# Patient Record
Sex: Female | Born: 1960 | Race: White | Hispanic: No | Smoking: Former smoker
Health system: Southern US, Community
[De-identification: ages and names within clinical notes are randomized; demographics above are authoritative.]

## PROBLEM LIST (undated history)

## (undated) DIAGNOSIS — D219 Benign neoplasm of connective and other soft tissue, unspecified: Secondary | ICD-10-CM

## (undated) DIAGNOSIS — I341 Nonrheumatic mitral (valve) prolapse: Secondary | ICD-10-CM

## (undated) DIAGNOSIS — R87619 Unspecified abnormal cytological findings in specimens from cervix uteri: Secondary | ICD-10-CM

## (undated) DIAGNOSIS — R011 Cardiac murmur, unspecified: Secondary | ICD-10-CM

## (undated) HISTORY — PX: DILATION AND CURETTAGE OF UTERUS: SHX78

## (undated) HISTORY — DX: Benign neoplasm of connective and other soft tissue, unspecified: D21.9

## (undated) HISTORY — DX: Nonrheumatic mitral (valve) prolapse: I34.1

## (undated) HISTORY — PX: ENDOMETRIAL BIOPSY: SHX622

## (undated) HISTORY — PX: OTHER SURGICAL HISTORY: SHX169

## (undated) HISTORY — DX: Unspecified abnormal cytological findings in specimens from cervix uteri: R87.619

## (undated) HISTORY — PX: TUBAL LIGATION: SHX77

## (undated) HISTORY — PX: REDUCTION MAMMAPLASTY: SUR839

## (undated) HISTORY — PX: CERVICAL BIOPSY  W/ LOOP ELECTRODE EXCISION: SUR135

## (undated) HISTORY — DX: Cardiac murmur, unspecified: R01.1

---

## 1998-11-09 ENCOUNTER — Other Ambulatory Visit: Admission: RE | Admit: 1998-11-09 | Discharge: 1998-11-09 | Payer: Self-pay | Admitting: Obstetrics and Gynecology

## 1999-12-07 ENCOUNTER — Other Ambulatory Visit: Admission: RE | Admit: 1999-12-07 | Discharge: 1999-12-07 | Payer: Self-pay | Admitting: Obstetrics and Gynecology

## 2001-07-12 ENCOUNTER — Other Ambulatory Visit: Admission: RE | Admit: 2001-07-12 | Discharge: 2001-07-12 | Payer: Self-pay | Admitting: Obstetrics and Gynecology

## 2001-08-16 ENCOUNTER — Ambulatory Visit (HOSPITAL_COMMUNITY): Admission: RE | Admit: 2001-08-16 | Discharge: 2001-08-16 | Payer: Self-pay | Admitting: Obstetrics and Gynecology

## 2001-08-16 ENCOUNTER — Encounter (INDEPENDENT_AMBULATORY_CARE_PROVIDER_SITE_OTHER): Payer: Self-pay | Admitting: Specialist

## 2002-08-27 ENCOUNTER — Other Ambulatory Visit: Admission: RE | Admit: 2002-08-27 | Discharge: 2002-08-27 | Payer: Self-pay | Admitting: Obstetrics and Gynecology

## 2004-06-29 ENCOUNTER — Other Ambulatory Visit: Admission: RE | Admit: 2004-06-29 | Discharge: 2004-06-29 | Payer: Self-pay | Admitting: Obstetrics and Gynecology

## 2005-09-20 ENCOUNTER — Other Ambulatory Visit: Admission: RE | Admit: 2005-09-20 | Discharge: 2005-09-20 | Payer: Self-pay | Admitting: Obstetrics and Gynecology

## 2008-09-23 ENCOUNTER — Encounter: Admission: RE | Admit: 2008-09-23 | Discharge: 2008-09-23 | Payer: Self-pay | Admitting: Obstetrics and Gynecology

## 2009-09-17 ENCOUNTER — Encounter: Payer: Self-pay | Admitting: Internal Medicine

## 2009-09-24 ENCOUNTER — Encounter (INDEPENDENT_AMBULATORY_CARE_PROVIDER_SITE_OTHER): Payer: Self-pay | Admitting: *Deleted

## 2009-10-02 ENCOUNTER — Ambulatory Visit: Payer: Self-pay | Admitting: Gastroenterology

## 2009-10-13 ENCOUNTER — Encounter: Admission: RE | Admit: 2009-10-13 | Discharge: 2009-10-13 | Payer: Self-pay | Admitting: Obstetrics and Gynecology

## 2010-10-12 NOTE — Letter (Signed)
Summary: [facility]   Imported By: Lester Streetman 10/06/2009 14:50:39  _____________________________________________________________________  External Attachment:    Type:   Image     Comment:   External Document

## 2010-10-12 NOTE — Miscellaneous (Signed)
Summary: Lec previsit  Clinical Lists Changes  Observations: Added new observation of NKA: T (10/02/2009 13:30)  Appended Document: Lec previsit Pt came in for her previsit and decided  she was not ready to have the colon yet. Will cancel her appt with Dr Christella Hartigan on 10/14/2009 and call back later to reschedule.

## 2010-10-12 NOTE — Letter (Signed)
Summary: Pre Visit No Show Letter  [facility]  [address] [place], [place] [address]   Phone: [phone]  Fax: [phone]        September 24, 2009 MRN: [id number]    Ashley Malone [address] [place], [place]  [address]    Dear Ms. SPRINGSTON,   We have been unable to reach you by phone concerning the pre-procedure visit that you missed on 09/24/2009. For this reason,your procedure scheduled on 10/05/2009 has been cancelled. Our scheduling staff will gladly assist you with rescheduling your appointments at a more convenient time. Please call our office at [phone] between the hours of 8:00am and 5:00pm, press option #2 to reach an appointment scheduler. Please consider updating your contact numbers at this time so that we can reach you by phone in the future with schedule changes or results.    Thank you,    Ezra Sites RN [facility]

## 2011-01-28 NOTE — H&P (Signed)
[facility]  Patient:    Ashley Malone, Ashley Malone Visit Number: [id number] MRN: [id number]          Service Type: Attending:  Juluis Mire, M.D. Dictated by:   Juluis Mire, M.D.                           History and Physical  HISTORY OF PRESENT ILLNESS:   [age]-year-old gravida 2, para 2, white female presents for permanent sterilization.  In relation to the present admission, the patient desires permanent sterilization.  Alternative forms of birth control have been discussed.  The potential irreversibility of sterilization was explained.  Failure rate of 1 in 200 was quoted.  Failures can be in the form of ectopic pregnancy, requiring further surgical management.  The patient voices understanding of indications and other options and desires to proceed with the permanent sterilization.  ALLERGIES:                    In terms of allergies, no known drug allergies.  MEDICATIONS:                  None.  PAST MEDICAL HISTORY:         Usual childhood diseases.  No significant sequelae.  No previous surgical history.  OBSTETRICAL HISTORY:          Two prior spontaneous vaginal deliveries.  FAMILY HISTORY:               Noncontributory.  SOCIAL HISTORY:               No tobacco or alcohol use.  REVIEW OF SYSTEMS:            Noncontributory.  PHYSICAL EXAMINATION:  VITAL SIGNS:                  Afebrile, stable vital signs.  HEENT:                        Normocephalic.  Pupils are equal, round, and reactive to light and accommodation.  Extraocular movements intact.  Sclerae and conjunctivae clear. Oropharynx clear.  NECK:                         Without thyromegaly.  BREASTS:                      No discrete masses.  LUNGS:                        Clear.  CARDIOVASCULAR:               Regular rhythm and rate.  No murmurs or gallops.  ABDOMEN:                      Benign.  No masses or organomegaly or tenderness.  PELVIC:                        Normal external genitalia.  Vaginal mucosa clear.  Cervix unremarkable.  Uterus normal size, shape, and contour.  Adnexa free of masses or tenderness.  EXTREMITIES:                  Trace edema.  NEUROLOGIC:  Grossly within normal limits.  IMPRESSION:                   Multiparity, desires sterility.  PLAN:                         The patient will undergo laparoscopic bilateral tubal fulguration.  The risks of surgery have been discussed including the risk of anesthesia, the risk of incisional infection or bruising, the risk of vascular injury that could require exploratory surgery for management.  The possibility of transfusion was discussed with the risk of AIDS or hepatitis. There is a risk of injury to adjacent organs including bladder or bowel that could require further exploratory surgery.  The risk of deep vein thrombosis and pulmonary embolus.  The patient appears to understand indications and risks ______. Dictated by:   Juluis Mire, M.D. Attending:  Juluis Mire, M.D. DD:  08/16/01 TD:  08/16/01 Job: [phone] [id number]

## 2011-01-28 NOTE — Op Note (Signed)
[facility]  Patient:    Ashley Malone, Ashley Malone Visit Number: [id number] MRN: [id number]          Service Type: DSU Location: [facility] Attending Physician:  Frederich Balding Dictated by:   Juluis Mire, M.D. Proc. Date: 08/16/01 Admit Date:  08/16/2001                             Operative Report  PREOPERATIVE DIAGNOSIS:       Multiparous, desires sterility.  POSTOPERATIVE DIAGNOSIS:      Multiparous, desires sterility, definition of a left tubular cyst.  OPERATION:                    Diagnostic laparoscopy, bilateral tubal fulguration, removal of left peritubular cyst.  SURGEON:                      Juluis Mire, M.D.  ANESTHESIA:                   General endotracheal.  ESTIMATED BLOOD LOSS:         Minimal.  PACKS AND DRAINS:             None.  INTRAOPERATIVE FLUID PLACEMENT:                    None.  COMPLICATIONS:                None.  INDICATIONS:                  See dictated history and physical.  DESCRIPTION OF PROCEDURE:     The patient was taken to the OR, placed in the supine position.  After satisfactory level of general endotracheal anesthesia was obtained, the patient was placed in the dorsal lithotomy position using the Allen stirrups.  The abdomen and perineum and vagina were prepped out with Betadine.  Examination showed the uterus to be of normal size and shape. Adnexa unremarkable.  Bladder was emptied by in-and-out catheterization.  The Hulka tenaculum was put in placed and secured.  The patient was draped out for laparoscopy.  A subumbilical was made with the knife.  The Veress needle was introduced in the abdominal cavity.  Insufflated approximately three liters of carbon dioxide.  The operative laparoscope was then introduced.  There was no evidence of injury to adjacent organs.  The appendix was visualized and noted to be normal.  Upper abdomen and liver tip and gallbladder were clear.  She did have some pericecal  adhesions.  Uterus was of normal size and shape. Right tube and ovary were unremarkable.  The left tube had approximately a 3-4 cm peritubular cyst.  The left ovary was unremarkable.  A 5 mm trocar was put in place in the suprapubic area under direct visualization.  The left tube was elevated.  An incision was made over the cyst into the mesosalpinx.  We used cautery for hemostasis.  We were able to dissect the cyst free from the tube and it was removed through the subumbilical port and sent to pathology. We used to cautery to bring about hemostasis.  Next, we did a bilateral tubal ligation.  Using the bipolar, a mid segment of each tube was coagulated for a distance of approximately 2 cm.  Coagulation was continued until resistance read 0 on the meter.  We then recoagulated the same segment, completely  desiccating each tube.  This coagulation did extend out to the mesosalpinx. An adequate segment of each tube was coagulated.  There was no evidence of injury to adjacent organs.  The site that the cyst was removed was hemostatically intact.  At this point in time, the abdomen was desufflated of carbon dioxide while trocars were removed.  Subumbilical incision was closed with interrupted subcuticular 4-0 Vicryl.  The suprapubic incision was closed with Steri-Strips.  The Hulka tenaculum was then removed.  The patient was taken out of the dorsal lithotomy position, was alert and extubated and transferred to the recovery room in good condition.  Sponge, instrument and needle counts were reported correct by the circulating nurse x 2. Dictated by:   Juluis Mire, M.D. Attending Physician:  Frederich Balding DD:  08/16/01 TD:  08/16/01 Job: [id number]

## 2011-02-18 ENCOUNTER — Encounter: Payer: Self-pay | Admitting: Internal Medicine

## 2011-03-01 ENCOUNTER — Encounter: Payer: Self-pay | Admitting: *Deleted

## 2011-03-14 ENCOUNTER — Other Ambulatory Visit: Payer: Self-pay | Admitting: Obstetrics and Gynecology

## 2011-03-14 HISTORY — PX: OTHER SURGICAL HISTORY: SHX169

## 2011-03-15 ENCOUNTER — Other Ambulatory Visit: Payer: Self-pay | Admitting: Internal Medicine

## 2011-03-18 ENCOUNTER — Ambulatory Visit (AMBULATORY_SURGERY_CENTER): Payer: BC Managed Care – PPO | Admitting: *Deleted

## 2011-03-18 VITALS — Ht 64.0 in | Wt 129.9 lb

## 2011-03-18 DIAGNOSIS — Z1211 Encounter for screening for malignant neoplasm of colon: Secondary | ICD-10-CM

## 2011-03-18 MED ORDER — PEG-KCL-NACL-NASULF-NA ASC-C 100 G PO SOLR: ORAL | Status: DC

## 2011-03-21 ENCOUNTER — Encounter: Payer: Self-pay | Admitting: Internal Medicine

## 2011-03-31 ENCOUNTER — Other Ambulatory Visit: Payer: Self-pay | Admitting: Internal Medicine

## 2011-04-07 ENCOUNTER — Other Ambulatory Visit: Payer: Self-pay | Admitting: Internal Medicine

## 2011-04-21 ENCOUNTER — Other Ambulatory Visit: Payer: Self-pay | Admitting: Internal Medicine

## 2011-05-02 ENCOUNTER — Other Ambulatory Visit (HOSPITAL_COMMUNITY): Payer: BC Managed Care – PPO

## 2011-05-04 ENCOUNTER — Ambulatory Visit (HOSPITAL_COMMUNITY)
Admission: RE | Admit: 2011-05-04 | Payer: BC Managed Care – PPO | Source: Ambulatory Visit | Admitting: Obstetrics and Gynecology

## 2011-05-04 ENCOUNTER — Encounter (HOSPITAL_COMMUNITY): Admission: RE | Payer: Self-pay | Source: Ambulatory Visit

## 2011-05-04 SURGERY — DILATATION & CURETTAGE/HYSTEROSCOPY WITH RESECTOCOPE
Anesthesia: Choice

## 2011-05-20 ENCOUNTER — Other Ambulatory Visit: Payer: Self-pay | Admitting: Obstetrics and Gynecology

## 2013-01-28 ENCOUNTER — Encounter: Payer: Self-pay | Admitting: Certified Nurse Midwife

## 2013-01-29 ENCOUNTER — Ambulatory Visit (INDEPENDENT_AMBULATORY_CARE_PROVIDER_SITE_OTHER): Payer: BC Managed Care – PPO | Admitting: Certified Nurse Midwife

## 2013-01-29 ENCOUNTER — Encounter: Payer: Self-pay | Admitting: Certified Nurse Midwife

## 2013-01-29 VITALS — BP 100/60 | Ht 63.5 in | Wt 126.0 lb

## 2013-01-29 DIAGNOSIS — Z0000 Encounter for general adult medical examination without abnormal findings: Secondary | ICD-10-CM

## 2013-01-29 DIAGNOSIS — Z01419 Encounter for gynecological examination (general) (routine) without abnormal findings: Secondary | ICD-10-CM

## 2013-01-29 LAB — POCT URINALYSIS DIPSTICK
Bilirubin, UA: NEGATIVE
Glucose, UA: NEGATIVE
Nitrite, UA: NEGATIVE
Urobilinogen, UA: NEGATIVE
pH, UA: 5

## 2013-01-29 NOTE — Patient Instructions (Addendum)

## 2013-01-29 NOTE — Progress Notes (Signed)
52 y.o. [id number] Single Caucasian Fe here for annual exam.  Menopausal no HRT now, stopped 6 months ago with no withdrawal bleeding.  No hot flashes or night sweats.  Prefers no HRT.  No vaginal dryness.  No STD concerns or screening.  Sees urgent care for problems.  Patient's last menstrual period was 09/12/2010.          Sexually active: no  The current method of family planning is tubal ligation.    Exercising: yes  walking daily Smoker:  no  Health Maintenance: Pap:  01-26-12 neg HPV HR neg MMG:  5/13 Colonoscopy:  none BMD:   2012 TDaP: less than 10 yrs   Labs: Poct urine-neg, Hgb-12.9 Self breast exam: occ   reports that she has quit smoking. She has never used smokeless tobacco. She reports that she drinks about 3.0 ounces of alcohol per week. She reports that she does not use illicit drugs.  Past Medical History  Diagnosis Date  . Mitral valve prolapse 5 YRS AGO    NOT TREATED  . Heart murmur   . Fibroid     Past Surgical History  Procedure Laterality Date  . Tubal ligation    . Breast lift    . Uterine biopsies  03-14-11  . Endometrial biopsy      atypical glandular cells  . Dilation and curettage of uterus      secondary to atypical glandular cells  . Endometrial polyp      No current outpatient prescriptions on file.   No current facility-administered medications for this visit.    Family History  Problem Relation Age of Onset  . Colon cancer Neg Hx     ROS:  Pertinent items are noted in HPI.  Otherwise, a comprehensive ROS was negative.  Exam:   BP 100/60  Ht 5' 3.5" (1.613 m)  Wt 126 lb (57.153 kg)  BMI 21.97 kg/m2  LMP 09/12/2010 Height: 5' 3.5" (161.3 cm)  Ht Readings from Last 3 Encounters:  01/29/13 5' 3.5" (1.613 m)  03/18/11 5\' 4"  (1.626 m)    General appearance: alert, cooperative and appears stated age Head: Normocephalic, without obvious abnormality, atraumatic Neck: no adenopathy, supple, symmetrical, trachea midline and thyroid  normal to inspection and palpation Lungs: clear to auscultation bilaterally Breasts: normal appearance, no masses or tenderness, No nipple retraction or dimpling, No nipple discharge or bleeding, No axillary or supraclavicular adenopathy Heart: regular rate and rhythm Abdomen: soft, non-tender; no masses,  no organomegaly Extremities: extremities normal, atraumatic, no cyanosis or edema Skin: Skin color, texture, turgor normal. No rashes or lesions Lymph nodes: Cervical, supraclavicular, and axillary nodes normal. No abnormal inguinal nodes palpated Neurologic: Grossly normal   Pelvic: External genitalia:  no lesions              Urethra:  normal appearing urethra with no masses, tenderness or lesions              Bartholin's and Skene's: normal                 Vagina: normal appearing vagina with normal color and discharge, no lesions              Cervix: normal, non tender              Pap taken: no Bimanual Exam:  Uterus:  normal and nodular feel, consistent with fibroid history, non tender              Adnexa: normal  adnexa and no mass, fullness, tenderness               Rectovaginal: Confirms               Anus:  normal sphincter tone, no lesions  A:  Well Woman with normal exam  Menopausal no HRT    P:  Reviewed health and wellness pertinent to exam  Discussed importance of evaluation if vaginal bleeding occurs.  Needs to advise   Pap smear as per guidelines   Mammogram yearly pap smear not taken today  counseled on mammography screening, STD prevention, adequate intake of calcium and vitamin D, diet and exercise And risks and benefits of colonoscopy desires referral. return annually or prn   An After Visit Summary was printed and given to the patient.  Reviewed, TL

## 2013-01-30 ENCOUNTER — Other Ambulatory Visit: Payer: Self-pay | Admitting: Obstetrics and Gynecology

## 2013-01-30 DIAGNOSIS — Z1211 Encounter for screening for malignant neoplasm of colon: Secondary | ICD-10-CM

## 2013-01-30 LAB — HEMOGLOBIN, FINGERSTICK: Hemoglobin, fingerstick: 12.9 g/dL (ref 12.0–16.0)

## 2013-01-30 NOTE — Addendum Note (Signed)
Addended by: Verner Chol on: 01/30/2013 03:04 PM   Modules accepted: Orders

## 2013-02-05 ENCOUNTER — Telehealth: Payer: Self-pay | Admitting: *Deleted

## 2013-02-05 NOTE — Telephone Encounter (Signed)
Patient was notified of appt. For consult colonoscopy June 5th @ 3;45pm

## 2013-08-14 DIAGNOSIS — L988 Other specified disorders of the skin and subcutaneous tissue: Secondary | ICD-10-CM | POA: Insufficient documentation

## 2013-10-02 ENCOUNTER — Encounter: Payer: Self-pay | Admitting: Certified Nurse Midwife

## 2014-02-04 ENCOUNTER — Ambulatory Visit: Payer: BC Managed Care – PPO | Admitting: Certified Nurse Midwife

## 2014-02-11 ENCOUNTER — Ambulatory Visit (INDEPENDENT_AMBULATORY_CARE_PROVIDER_SITE_OTHER): Payer: BC Managed Care – PPO | Admitting: Certified Nurse Midwife

## 2014-02-11 ENCOUNTER — Encounter: Payer: Self-pay | Admitting: Certified Nurse Midwife

## 2014-02-11 ENCOUNTER — Other Ambulatory Visit: Payer: Self-pay | Admitting: Certified Nurse Midwife

## 2014-02-11 VITALS — BP 120/72 | HR 70 | Resp 16 | Ht 63.75 in | Wt 129.0 lb

## 2014-02-11 DIAGNOSIS — Z23 Encounter for immunization: Secondary | ICD-10-CM

## 2014-02-11 DIAGNOSIS — Z01419 Encounter for gynecological examination (general) (routine) without abnormal findings: Secondary | ICD-10-CM

## 2014-02-11 DIAGNOSIS — Z124 Encounter for screening for malignant neoplasm of cervix: Secondary | ICD-10-CM

## 2014-02-11 DIAGNOSIS — Z1211 Encounter for screening for malignant neoplasm of colon: Secondary | ICD-10-CM

## 2014-02-11 DIAGNOSIS — Z0000 Encounter for general adult medical examination without abnormal findings: Secondary | ICD-10-CM

## 2014-02-11 LAB — POCT URINALYSIS DIPSTICK
BILIRUBIN UA: NEGATIVE
Glucose, UA: NEGATIVE
Ketones, UA: NEGATIVE
Leukocytes, UA: NEGATIVE
NITRITE UA: NEGATIVE
Protein, UA: NEGATIVE
RBC UA: NEGATIVE
UROBILINOGEN UA: NEGATIVE
pH, UA: 5

## 2014-02-11 LAB — HEMOGLOBIN, FINGERSTICK: HEMOGLOBIN, FINGERSTICK: 13.6 g/dL (ref 12.0–16.0)

## 2014-02-11 NOTE — Progress Notes (Signed)
53 y.o. G17P2002 Divorced Caucasian Fe here for annual exam.  Menopausal no HRT. Denies any vaginal bleeding or vaginal dryness. Rare hot flashes or night sweats.  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 Sees only Urgent care if problems. No STD concerns or testing needed. Request screening labs today. No other health issues.  Patient's last menstrual period was 09/12/2010.          Sexually active: yes  The current method of family planning is tubal ligation.    Exercising: yes  walking Smoker:  no  Health Maintenance: Pap:  01-26-12 neg HPV HR neg MMG:  2014 normal per patient  Colonoscopy:  none BMD:   2012 TDaP:  unsure Labs: Poct urine-neg, Hgb-13.6 Self breast exam: done occ   reports that she has quit smoking. She has never used smokeless tobacco. She reports that she drinks about 3 - 4.2 ounces of alcohol per week. She reports that she does not use illicit drugs.  Past Medical History  Diagnosis Date  . Mitral valve prolapse 5 YRS AGO    NOT TREATED  . Heart murmur   . Fibroid     Past Surgical History  Procedure Laterality Date  . Tubal ligation    . Breast lift    . Uterine biopsies  03-14-11  . Endometrial biopsy      atypical glandular cells  . Dilation and curettage of uterus      secondary to atypical glandular cells  . Endometrial polyp      Current Outpatient Prescriptions  Medication Sig Dispense Refill  . doxycycline (VIBRAMYCIN) 100 MG capsule as needed.      . Sulfacetamide Sodium-Sulfur 10-5 % EMUL as needed.       No current facility-administered medications for this visit.    Family History  Problem Relation Age of Onset  . Colon cancer Neg Hx     ROS:  Pertinent items are noted in HPI.  Otherwise, a comprehensive ROS was negative.  Exam:   BP 120/72  Pulse 70  Resp 16  Ht 5' 3.75" (1.619 m)  Wt 129 lb (58.514 kg)  BMI 22.32 kg/m2  LMP 09/12/2010 Height: 5' 3.75" (161.9 cm)  Ht Readings from Last 3 Encounters:  02/11/14 5' 3.75" (1.619 m)  01/29/13  5' 3.5" (1.613 m)  03/18/11 5\' 4"  (1.626 m)    General appearance: alert, cooperative and appears stated age Head: Normocephalic, without obvious abnormality, atraumatic Neck: no adenopathy, supple, symmetrical, trachea midline and thyroid normal to inspection and palpation and non-palpable Lungs: clear to auscultation bilaterally Breasts: normal appearance, no masses or tenderness, No nipple retraction or dimpling, No nipple discharge or bleeding, No axillary or supraclavicular adenopathy Heart: regular rate and rhythm Abdomen: soft, non-tender; no masses,  no organomegaly Extremities: extremities normal, atraumatic, no cyanosis or edema Skin: Skin color, texture, turgor normal. No rashes or lesions Lymph nodes: Cervical, supraclavicular, and axillary nodes normal. No abnormal inguinal nodes palpated Neurologic: Grossly normal   Pelvic: External genitalia:  no lesions              Urethra:  normal appearing urethra with no masses, tenderness or lesions              Bartholin's and Skene's: normal                 Vagina: normal appearing vagina with normal color and discharge, no lesions              Cervix: normal, non tender  bleeding with pap only              Pap taken: yes Bimanual Exam:  Uterus:  normal size, contour, position, consistency, mobility, non-tender and anteverted              Adnexa: normal adnexa and no mass, fullness, tenderness               Rectovaginal: Confirms               Anus:  normal sphincter tone, no lesions  A:  Well Woman with normal exam  Menopausal no HRT  Colonoscopy due  Screening labs  Immunization update  P:   Reviewed health and wellness pertinent to exam  Aware of need to evaluate if vaginal bleeding  Discussed risk and benefits of colonoscopy declines scheduling at this time. IFOB dispensed  Labs: CMP,Lipid panel,TSH, Vitamin D  Pap smear taken today with HPV reflex  Mammogram yearly stressed  Requests TDAP  counseled on breast self  exam, mammography screening, STD prevention, HIV risk factors and prevention, adequate intake of calcium and vitamin D, diet and exercise  return annually or prn  An After Visit Summary was printed and given to the patient.

## 2014-02-11 NOTE — Patient Instructions (Signed)

## 2014-02-12 LAB — COMPREHENSIVE METABOLIC PANEL
ALBUMIN: 4.3 g/dL (ref 3.5–5.2)
ALT: 27 U/L (ref 0–35)
AST: 27 U/L (ref 0–37)
Alkaline Phosphatase: 55 U/L (ref 39–117)
BUN: 13 mg/dL (ref 6–23)
CHLORIDE: 99 meq/L (ref 96–112)
CO2: 27 meq/L (ref 19–32)
Calcium: 9.6 mg/dL (ref 8.4–10.5)
Creat: 0.76 mg/dL (ref 0.50–1.10)
Glucose, Bld: 105 mg/dL — ABNORMAL HIGH (ref 70–99)
Potassium: 4.4 mEq/L (ref 3.5–5.3)
Sodium: 141 mEq/L (ref 135–145)
TOTAL PROTEIN: 6.9 g/dL (ref 6.0–8.3)
Total Bilirubin: 0.5 mg/dL (ref 0.2–1.2)

## 2014-02-12 LAB — LIPID PANEL
CHOL/HDL RATIO: 3.3 ratio
Cholesterol: 210 mg/dL — ABNORMAL HIGH (ref 0–200)
HDL: 63 mg/dL (ref >39)
LDL Cholesterol: 123 mg/dL — ABNORMAL HIGH (ref 0–99)
Triglycerides: 120 mg/dL (ref <150)
VLDL: 24 mg/dL (ref 0–40)

## 2014-02-12 LAB — VITAMIN D 25 HYDROXY (VIT D DEFICIENCY, FRACTURES): Vit D, 25-Hydroxy: 50 ng/mL (ref 30–89)

## 2014-02-12 LAB — HEMOGLOBIN A1C
Hgb A1c MFr Bld: 5.3 % (ref <5.7)
Mean Plasma Glucose: 105 mg/dL (ref <117)

## 2014-02-12 LAB — TSH: TSH: 1.761 u[IU]/mL (ref 0.350–4.500)

## 2014-02-13 LAB — IPS PAP TEST WITH REFLEX TO HPV

## 2014-02-14 NOTE — Progress Notes (Signed)
Reviewed personally.  M. Suzanne Garry Nicolini, MD.  

## 2014-04-21 ENCOUNTER — Telehealth: Payer: Self-pay

## 2014-04-21 NOTE — Telephone Encounter (Signed)
Pt states she will do it & turn it in to [place]

## 2014-04-21 NOTE — Telephone Encounter (Signed)
Message copied by Susy Manor on Mon Apr 21, 2014 11:35 AM ------      Message from: Regina Eck      Created: Sat Apr 19, 2014  4:39 PM       IFOB not completed please call ------

## 2014-06-24 ENCOUNTER — Encounter: Payer: Self-pay | Admitting: Certified Nurse Midwife

## 2014-07-14 ENCOUNTER — Encounter: Payer: Self-pay | Admitting: Certified Nurse Midwife

## 2015-02-13 ENCOUNTER — Ambulatory Visit (INDEPENDENT_AMBULATORY_CARE_PROVIDER_SITE_OTHER): Payer: BLUE CROSS/BLUE SHIELD | Admitting: Certified Nurse Midwife

## 2015-02-13 ENCOUNTER — Encounter: Payer: Self-pay | Admitting: Certified Nurse Midwife

## 2015-02-13 VITALS — BP 110/72 | HR 70 | Resp 16 | Ht 63.75 in | Wt 127.0 lb

## 2015-02-13 DIAGNOSIS — Z124 Encounter for screening for malignant neoplasm of cervix: Secondary | ICD-10-CM | POA: Diagnosis not present

## 2015-02-13 DIAGNOSIS — Z01419 Encounter for gynecological examination (general) (routine) without abnormal findings: Secondary | ICD-10-CM

## 2015-02-13 DIAGNOSIS — Z1211 Encounter for screening for malignant neoplasm of colon: Secondary | ICD-10-CM

## 2015-02-13 DIAGNOSIS — Z0000 Encounter for general adult medical examination without abnormal findings: Secondary | ICD-10-CM

## 2015-02-13 NOTE — Progress Notes (Signed)
Reviewed personally.  M. Suzanne Adalynne Steffensmeier, MD.  

## 2015-02-13 NOTE — Patient Instructions (Signed)
EXERCISE AND DIET:  We recommended that you start or continue a regular exercise program for good health. Regular exercise means any activity that makes your heart beat faster and makes you sweat.  We recommend exercising at least 30 minutes per day at least 3 days a week, preferably 4 or 5.  We also recommend a diet low in fat and sugar.  Inactivity, poor dietary choices and obesity can cause diabetes, heart attack, stroke, and kidney damage, among others.    ALCOHOL AND SMOKING:  Women should limit their alcohol intake to no more than 7 drinks/beers/glasses of wine (combined, not each!) per week. Moderation of alcohol intake to this level decreases your risk of breast cancer and liver damage. And of course, no recreational drugs are part of a healthy lifestyle.  And absolutely no smoking or even second hand smoke. Most people know smoking can cause heart and lung diseases, but did you know it also contributes to weakening of your bones? Aging of your skin?  Yellowing of your teeth and nails?  CALCIUM AND VITAMIN D:  Adequate intake of calcium and Vitamin D are recommended.  The recommendations for exact amounts of these supplements seem to change often, but generally speaking 600 mg of calcium (either carbonate or citrate) and 800 units of Vitamin D per day seems prudent. Certain women may benefit from higher intake of Vitamin D.  If you are among these women, your doctor will have told you during your visit.    PAP SMEARS:  Pap smears, to check for cervical cancer or precancers,  have traditionally been done yearly, although recent scientific advances have shown that most women can have pap smears less often.  However, every woman still should have a physical exam from her gynecologist every year. It will include a breast check, inspection of the vulva and vagina to check for abnormal growths or skin changes, a visual exam of the cervix, and then an exam to evaluate the size and shape of the uterus and  ovaries.  And after 56 years of age, a rectal exam is indicated to check for rectal cancers. We will also provide age appropriate advice regarding health maintenance, like when you should have certain vaccines, screening for sexually transmitted diseases, bone density testing, colonoscopy, mammograms, etc.   MAMMOGRAMS:  All women over 40 years old should have a yearly mammogram. Many facilities now offer a "3D" mammogram, which may cost around $50 extra out of pocket. If possible,  we recommend you accept the option to have the 3D mammogram performed.  It both reduces the number of women who will be called back for extra views which then turn out to be normal, and it is better than the routine mammogram at detecting truly abnormal areas.    COLONOSCOPY:  Colonoscopy to screen for colon cancer is recommended for all women at age 50.  We know, you hate the idea of the prep.  We agree, BUT, having colon cancer and not knowing it is worse!!  Colon cancer so often starts as a polyp that can be seen and removed at colonscopy, which can quite literally save your life!  And if your first colonoscopy is normal and you have no family history of colon cancer, most women don't have to have it again for 10 years.  Once every ten years, you can do something that may end up saving your life, right?  We will be happy to help you get it scheduled when you are ready.    Be sure to check your insurance coverage so you understand how much it will cost.  It may be covered as a preventative service at no cost, but you should check your particular policy.     Colonoscopy A colonoscopy is an exam to look at the entire large intestine (colon). This exam can help find problems such as tumors, polyps, inflammation, and areas of bleeding. The exam takes about 1 hour.  LET [facility] CARE PROVIDER KNOW ABOUT:   Any allergies you have.  All medicines you are taking, including vitamins, herbs, eye drops, creams, and over-the-counter  medicines.  Previous problems you or members of your family have had with the use of anesthetics.  Any blood disorders you have.  Previous surgeries you have had.  Medical conditions you have. RISKS AND COMPLICATIONS  Generally, this is a safe procedure. However, as with any procedure, complications can occur. Possible complications include:  Bleeding.  Tearing or rupture of the colon wall.  Reaction to medicines given during the exam.  Infection (rare). BEFORE THE PROCEDURE   Ask your health care provider about changing or stopping your regular medicines.  You may be prescribed an oral bowel prep. This involves drinking a large amount of medicated liquid, starting the day before your procedure. The liquid will cause you to have multiple loose stools until your stool is almost clear or light green. This cleans out your colon in preparation for the procedure.  Do not eat or drink anything else once you have started the bowel prep, unless your health care provider tells you it is safe to do so.  Arrange for someone to drive you home after the procedure. PROCEDURE   You will be given medicine to help you relax (sedative).  You will lie on your side with your knees bent.  A long, flexible tube with a light and camera on the end (colonoscope) will be inserted through the rectum and into the colon. The camera sends video back to a computer screen as it moves through the colon. The colonoscope also releases carbon dioxide gas to inflate the colon. This helps your health care provider see the area better.  During the exam, your health care provider may take a small tissue sample (biopsy) to be examined under a microscope if any abnormalities are found.  The exam is finished when the entire colon has been viewed. AFTER THE PROCEDURE   Do not drive for 24 hours after the exam.  You may have a small amount of blood in your stool.  You may pass moderate amounts of gas and have mild  abdominal cramping or bloating. This is caused by the gas used to inflate your colon during the exam.  Ask when your test results will be ready and how you will get your results. Make sure you get your test results. Document Released: 08/26/2000 Document Revised: 06/19/2013 Document Reviewed: 05/06/2013 [facility] Patient Information 2015 [place], [place]. This information is not intended to replace advice given to you by your health care provider. Make sure you discuss any questions you have with your health care provider.

## 2015-02-13 NOTE — Progress Notes (Signed)
54 y.o. G72P2002 Divorced  Caucasian Fe here for annual exam.  Menopausal occasional night sweats no issues. Denies vaginal bleeding or vaginal dryness. Sees Urgent care if needed. Aware she is due for mammogram. Requests referral for colonoscopy. New granddaughter this year!! No other health concerns this year.  Patient's last menstrual period was 09/12/2010.          Sexually active: Yes.    The current method of family planning is tubal ligation.    Exercising: Yes.    walking & yoga Smoker:  no  Health Maintenance: Pap: 02-11-14 neg MMG:  2015 neg per patient Colonoscopy: none BMD:   2012 TDaP: 6/ 2015 Labs: none Self breast exam: done monthly   reports that she has quit smoking. She has never used smokeless tobacco. She reports that she drinks about 3.6 oz of alcohol per week. She reports that she does not use illicit drugs.  Past Medical History  Diagnosis Date  . Mitral valve prolapse 5 YRS AGO    NOT TREATED  . Heart murmur   . Fibroid     Past Surgical History  Procedure Laterality Date  . Tubal ligation    . Breast lift    . Uterine biopsies  03-14-11  . Endometrial biopsy      atypical glandular cells  . Dilation and curettage of uterus      secondary to atypical glandular cells  . Endometrial polyp      Current Outpatient Prescriptions  Medication Sig Dispense Refill  . doxycycline (VIBRAMYCIN) 100 MG capsule as needed.     No current facility-administered medications for this visit.    Family History  Problem Relation Age of Onset  . Colon cancer Neg Hx     ROS:  Pertinent items are noted in HPI.  Otherwise, a comprehensive ROS was negative.  Exam:   BP 110/72 mmHg  Pulse 70  Resp 16  Ht 5' 3.75" (1.619 m)  Wt 127 lb (57.607 kg)  BMI 21.98 kg/m2  LMP 09/12/2010 Height: 5' 3.75" (161.9 cm) Ht Readings from Last 3 Encounters:  02/13/15 5' 3.75" (1.619 m)  02/11/14 5' 3.75" (1.619 m)  01/29/13 5' 3.5" (1.613 m)    General appearance: alert,  cooperative and appears stated age Head: Normocephalic, without obvious abnormality, atraumatic Neck: no adenopathy, supple, symmetrical, trachea midline and thyroid normal to inspection and palpation Lungs: clear to auscultation bilaterally Breasts: normal appearance, no masses or tenderness, Inspection negative, No nipple retraction or dimpling, No nipple discharge or bleeding Heart: regular rate and rhythm Abdomen: soft, non-tender; no masses,  no organomegaly Extremities: extremities normal, atraumatic, no cyanosis or edema Skin: Skin color, texture, turgor normal. No rashes or lesions Lymph nodes: Cervical, supraclavicular, and axillary nodes normal. No abnormal inguinal nodes palpated Neurologic: Grossly normal   Pelvic: External genitalia:  no lesions              Urethra:  normal appearing urethra with no masses, tenderness or lesions              Bartholin's and Skene's: normal                 Vagina: normal appearing vagina with normal color and discharge, no lesions              Cervix: normal,non tender,no lesions, bleeding with pap only              Pap taken: Yes.   Bimanual Exam:  Uterus:  normal size, contour, position, consistency, mobility, non-tender              Adnexa: normal adnexa and no mass, fullness, tenderness               Rectovaginal: Confirms               Anus:  normal sphincter tone, no lesions  Chaperone present: Yes  A:  Well Woman with normal exam  Menopausal no HRT  Schedule screening lab  Colonoscopy due  P:   Reviewed health and wellness pertinent to exam  Aware of need to evaluate if vaginal bleeding  Lab Lipid panel  Request referral for colonoscopy will be contacted with information referral placed  Pap smear taken with HPVHR   counseled on breast self exam, mammography screening, adequate intake of calcium and vitamin D, diet and exercise  return annually or prn  An After Visit Summary was printed and given to the patient.

## 2015-02-18 ENCOUNTER — Other Ambulatory Visit: Payer: BLUE CROSS/BLUE SHIELD

## 2015-02-18 ENCOUNTER — Telehealth: Payer: Self-pay | Admitting: Certified Nurse Midwife

## 2015-02-18 LAB — IPS PAP TEST WITH HPV

## 2015-02-18 NOTE — Telephone Encounter (Signed)
Left message on voicemail regarding missed lab appointment. Call to reschedule.

## 2015-02-19 ENCOUNTER — Other Ambulatory Visit: Payer: Self-pay | Admitting: Certified Nurse Midwife

## 2015-02-19 DIAGNOSIS — R87610 Atypical squamous cells of undetermined significance on cytologic smear of cervix (ASC-US): Secondary | ICD-10-CM

## 2015-02-19 DIAGNOSIS — R87810 Cervical high risk human papillomavirus (HPV) DNA test positive: Principal | ICD-10-CM

## 2015-02-19 NOTE — Telephone Encounter (Signed)
Please follow up on this

## 2015-02-19 NOTE — Telephone Encounter (Signed)
Message left asking pt to call & schedule fasting labs

## 2015-02-23 ENCOUNTER — Telehealth: Payer: Self-pay | Admitting: Emergency Medicine

## 2015-02-23 NOTE — Telephone Encounter (Signed)
-----   Message from Regina Eck, CNM sent at 02/19/2015  8:17 AM EDT ----- Notify patient that her pap smear is abnormal with ASCUS positive HPVHR, needs colposcopy exam scheduled, order in [facility] to schedule with DL

## 2015-02-25 NOTE — Telephone Encounter (Addendum)
Message left to return call to [place] at [phone].   History of Bilateral Tubal Ligation.  Post menopausal.  pre-cert complete/pr $23//RAQ

## 2015-02-27 NOTE — Telephone Encounter (Signed)
Patient notified of message from Regina Eck CNM.  She is agreeable to scheduling colposcopy.  Brief description of procedure given to patient.  Colposcopy pre-procedure instructions given. Patient post menopausal and hx of Tubal ligation.  Make sure to eat a meal and hydrate before appointment.  Advised 800 mg of Motrin with food one hour prior to appointment. Motrin/Advil or Ibuprofen. Take 800 mg (Can purchase over the counter, you will need four 200 mg pills).  Advised will need to cancel or reschedule within 72 hours or will have $150.00 no show fee placed to account.   Patient verbalized understanding of preprocedure instructions and cancellation policy.         Notified patient of cost of procedure 50.00 will be collected at check in.  Routing to provider for final review. Patient agreeable to disposition. Will close encounter.

## 2015-03-09 ENCOUNTER — Telehealth: Payer: Self-pay | Admitting: Certified Nurse Midwife

## 2015-03-09 NOTE — Telephone Encounter (Signed)
Pt returned call. Benefit reviewed. Pt agreeable.

## 2015-03-09 NOTE — Telephone Encounter (Signed)
Called patient to review benefits for procedure. Left voicemail to call back and review. °

## 2015-03-11 ENCOUNTER — Ambulatory Visit (INDEPENDENT_AMBULATORY_CARE_PROVIDER_SITE_OTHER): Payer: BLUE CROSS/BLUE SHIELD | Admitting: Certified Nurse Midwife

## 2015-03-11 ENCOUNTER — Encounter: Payer: Self-pay | Admitting: Certified Nurse Midwife

## 2015-03-11 DIAGNOSIS — R87810 Cervical high risk human papillomavirus (HPV) DNA test positive: Secondary | ICD-10-CM | POA: Diagnosis not present

## 2015-03-11 DIAGNOSIS — R87610 Atypical squamous cells of undetermined significance on cytologic smear of cervix (ASC-US): Secondary | ICD-10-CM

## 2015-03-11 NOTE — Progress Notes (Signed)
Patient ID: Ashley Malone, female   DOB: 04-Nov-1960, 54 y.o.   MRN: [id number]  Chief Complaint  Patient presents with  . Colposcopy    HPI Ashley Malone is a 54 y.o. white married g2p2002 female here for colposcopy exam. Denies vaginal bleeding or pain. HPI  Indications: Pap smear on 6/3 2016 showed: ASCUS with POSITIVE high risk HPV. 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 Prior cervical treatment: D&C.  Past Medical History  Diagnosis Date  . Mitral valve prolapse 5 YRS AGO    NOT TREATED  . Heart murmur   . Fibroid   . Abnormal Pap smear of cervix     6/15 ASCUS HPV HR +    Past Surgical History  Procedure Laterality Date  . Tubal ligation    . Breast lift    . Uterine biopsies  03-14-11  . Endometrial biopsy      atypical glandular cells  . Dilation and curettage of uterus      secondary to atypical glandular cells  . Endometrial polyp      Family History  Problem Relation Age of Onset  . Colon cancer Neg Hx   . Heart murmur Mother     Social History History  Substance Use Topics  . Smoking status: Former Research scientist (life sciences)  . Smokeless tobacco: Never Used  . Alcohol Use: 3.6 oz/week    6 Glasses of wine per week    No Known Allergies  No current outpatient prescriptions on file.   No current facility-administered medications for this visit.    Review of Systems Review of Systems  Constitutional: Negative.   Genitourinary: Negative for vaginal bleeding, vaginal discharge and vaginal pain.    Blood pressure 122/72, pulse 70, resp. rate 16, height 5' 3.75" (1.619 m), weight 125 lb (56.7 kg), last menstrual period 09/12/2010.  Physical Exam Physical Exam  Constitutional: She is oriented to person, place, and time. She appears well-developed and well-nourished.  Genitourinary: Vagina normal.    Neurological: She is alert and oriented to person, place, and time.  Skin: Skin is warm and dry.  Psychiatric: She has a normal  mood and affect. Her behavior is normal. Judgment and thought content normal.    Data Reviewed Reviewed pap smear results and addressed questions regarding HPV.  Assessment    Procedure Details  The risks and benefits of the procedure and Written informed consent obtained.  Speculum placed in vagina and excellent visualization of cervix achieved, cervix swabbed x 3 with saline and with acetic acid solution. ACE noted at 7 and 2 o'clock. Lugol's solution applied with non staining noted at same area. Biopsies taken at 7 and 2 o'clock of cervix. ECC obtained. Monsel's applied. No active bleeding on speculum removal. Patient tolerated procedure well. Instructions given.  Specimens: 3  Complications: none.     Plan    Specimens labelled and sent to Pathology. Patient will be notified of results when reviewed.Marland Kitchen      Ashley Malone 03/11/2015, 2:20 PM

## 2015-03-11 NOTE — Patient Instructions (Signed)

## 2015-03-11 NOTE — Progress Notes (Signed)
02-12-14 ASCUS HPV HR + Pt took 1 advil at 1pm

## 2015-03-12 NOTE — Progress Notes (Signed)
Reviewed personally.  M. Suzanne Youcef Klas, MD.  

## 2015-03-13 LAB — IPS OTHER TISSUE BIOPSY

## 2015-03-19 ENCOUNTER — Telehealth: Payer: Self-pay | Admitting: Emergency Medicine

## 2015-03-19 NOTE — Telephone Encounter (Signed)
-----   Message from Regina Eck, CNM sent at 03/19/2015  8:49 AM EDT ----- Notify patient that both biopsies showed no dysplasia or HPV effect, Ecc negative for dysplasia or HPV effect. Needs repeat pap in 6 months.  Pap recall

## 2015-03-19 NOTE — Telephone Encounter (Signed)
Return call to Tracy. °

## 2015-03-19 NOTE — Telephone Encounter (Addendum)
Message left to return call to [place] at [phone].  Advised message non-urgent.  06 Recall placed for 6 month pap.

## 2015-03-19 NOTE — Telephone Encounter (Signed)
Return call to patient. She is given results and verbalized understanding. She is scheduled for follow up pap smear 10/20/15 at 1345 arrival at 1330. Patient agreeable and will follow up as scheduled. Routing to provider for final review. Patient agreeable to disposition. Will close encounter.

## 2015-06-26 ENCOUNTER — Other Ambulatory Visit: Payer: Self-pay

## 2015-06-30 ENCOUNTER — Telehealth: Payer: Self-pay | Admitting: Certified Nurse Midwife

## 2015-06-30 NOTE — Telephone Encounter (Signed)
Spoke with patient. Patient states that she has been experiencing left breast tenderness close to the axilla for one week. Tenderness is constant. Left breast feel swollen to the patient. Denies any color changes to the breast or any nipple discharge. "It is just concerning since it is not going away."  Advised she will need to be seen in the office for further evaluation. Patient is agreeable. Appointment scheduled for 10/19 at 10 am with Melvia Heaps CNM. Agreeable to date and time.  Routing to provider for final review. Patient agreeable to disposition. Will close encounter.

## 2015-06-30 NOTE — Telephone Encounter (Signed)
Patient calling requesting to speak with the nurse about "breast soreness."

## 2015-07-01 ENCOUNTER — Ambulatory Visit (INDEPENDENT_AMBULATORY_CARE_PROVIDER_SITE_OTHER): Payer: BLUE CROSS/BLUE SHIELD | Admitting: Certified Nurse Midwife

## 2015-07-01 ENCOUNTER — Other Ambulatory Visit: Payer: Self-pay | Admitting: Certified Nurse Midwife

## 2015-07-01 ENCOUNTER — Encounter: Payer: Self-pay | Admitting: Certified Nurse Midwife

## 2015-07-01 VITALS — BP 120/78 | HR 70 | Resp 16 | Ht 63.75 in | Wt 128.0 lb

## 2015-07-01 DIAGNOSIS — N63 Unspecified lump in breast: Secondary | ICD-10-CM

## 2015-07-01 DIAGNOSIS — N644 Mastodynia: Secondary | ICD-10-CM

## 2015-07-01 DIAGNOSIS — N6320 Unspecified lump in the left breast, unspecified quadrant: Secondary | ICD-10-CM

## 2015-07-01 NOTE — Progress Notes (Signed)
Scheduled patient while in office for bilateral diagnostic mammogram and left breast ultrasound at the [facility] for 07/07/2015 at 9 am. [facility] the patient has not been seen since 2011. Patient does not feel she has been to another location for imaging since then. 6 week follow up scheduled with Melvia Heaps CNM on 08/11/2015 at 11 am. Patient is agreeable to all dates and times.

## 2015-07-01 NOTE — Progress Notes (Signed)
Encounter reviewed Ceonna Frazzini, MD   

## 2015-07-01 NOTE — Patient Instructions (Signed)

## 2015-07-01 NOTE — Progress Notes (Signed)
   Subjective:   54 y.o. Divorced Caucasian female presents for evaluation of left breast tenderness. Onset of the symptoms for the past 2 weeks. Patient sought evaluation because of breast tenderness and and tingling into under axilla..  Contributing factors none. . Patient denies hiistory of trauma, bites, or injuries. Denies any masses or skin change or nipple discharge. No excessive lifting,exercise or sexual activity with breast. No family history of breast cancer. Last mammogram was one year per patient, no report available.  Previous evaluation has never been done for any tenderness or masses. Does SBE and has not noted any changes except for the tenderness.Patient did have colonoscopy done 2-3 weeks ago with polyp noted, 5 year repeat. No other health concerns today.   Review of Systems Pertinent items are noted in HPI.   Objective:   General appearance: alert, cooperative and appears stated age Breasts: normal appearance, no masses or tenderness, No nipple retraction or dimpling, No nipple discharge or bleeding, No axillary or supraclavicular adenopathy, positive findings: left breast mass at edge of left breast at 2 o'clock, slightly tender to touch, soft, mobile, no nipple discharge ? Lymph node.     Assessment:   ASSESSMENT:Patient is diagnosed with breast mass and tenderness in left breast. Right breast no masses palpated.  Plan:   PLAN:  Reviewed finding with patient and need for evaluation with diagnositic mammogram and [place]. Patient agreeable. Patient will be scheduled prior to leaving office. Questions addressed.

## 2015-07-07 ENCOUNTER — Ambulatory Visit
Admission: RE | Admit: 2015-07-07 | Discharge: 2015-07-07 | Disposition: A | Discharge status: Home / Self Care | Payer: BLUE CROSS/BLUE SHIELD | Source: Ambulatory Visit | Attending: Certified Nurse Midwife | Admitting: Certified Nurse Midwife

## 2015-07-07 DIAGNOSIS — N6320 Unspecified lump in the left breast, unspecified quadrant: Secondary | ICD-10-CM

## 2015-08-10 ENCOUNTER — Telehealth: Payer: Self-pay | Admitting: Certified Nurse Midwife

## 2015-08-10 NOTE — Telephone Encounter (Signed)
Left message on voicemail to call and reschedule cancelled appointment. °

## 2015-08-11 ENCOUNTER — Ambulatory Visit: Payer: BLUE CROSS/BLUE SHIELD | Admitting: Certified Nurse Midwife

## 2015-08-12 ENCOUNTER — Telehealth: Payer: Self-pay | Admitting: *Deleted

## 2015-08-12 NOTE — Telephone Encounter (Signed)
Follow-up call to patient to reschedule breast check appointment that was canceled by provider. Left message to call back. Can speak to any staff member to reschedule.

## 2015-08-24 NOTE — Telephone Encounter (Signed)
-----   Message from Ashley Malone, CNM sent at 07/07/2015 10:44 AM EDT ----- Reviewed result showing no masses or concerns, with scattered  Fibroglandular density and minimal thickening noted.  Patient has appointment for follow up her regarding tenderness and needs to keep appt. Still in mm hold

## 2015-08-24 NOTE — Telephone Encounter (Signed)
Message left to return call to Harshika Mago at [phone].    

## 2015-09-22 NOTE — Telephone Encounter (Signed)
Follow-up call to patient regarding breast recheck. Patient apologizes for delay in calling back. Her mother has been ill and in hospital. Just goy her home from hospital, she is doing better. Stressed importance of follow-up of any palpable breast mass and recommend recheck appointment with Debbi. Patient agreeable and appointment scheduled for 09-29-15 at 1100.  Routing to provider for final review. Patient agreeable to disposition. Will close encounter.    CC: Karen Chafe, RN

## 2015-09-29 ENCOUNTER — Ambulatory Visit (INDEPENDENT_AMBULATORY_CARE_PROVIDER_SITE_OTHER): Payer: BLUE CROSS/BLUE SHIELD | Admitting: Certified Nurse Midwife

## 2015-09-29 ENCOUNTER — Encounter: Payer: Self-pay | Admitting: Certified Nurse Midwife

## 2015-09-29 VITALS — BP 110/70 | HR 68 | Resp 16 | Ht 63.75 in | Wt 130.0 lb

## 2015-09-29 DIAGNOSIS — Z1239 Encounter for other screening for malignant neoplasm of breast: Secondary | ICD-10-CM | POA: Diagnosis not present

## 2015-09-29 NOTE — Patient Instructions (Signed)
Breast Self-Awareness  Breast self-awareness allows you to notice a breast problem early while it is still small. Do a breast self-exam:  · Every month, 5-7 days after your period (menstrual period).  · At the same time each month if you do not have periods anymore.  Look for any:  · Difference between your breasts (size, shape, or position).  · Change in breast shape or size.  · Fluid or blood coming from your nipples.  · Changes in your nipples (dimpling, nipple movement).  ·  Change in skin color or texture (redness, scaly areas).  Feel for:  · Lumps.  · Bumps.  · Dips.  · Any other changes.  HOW TO DO A BREAST SELF-EXAM  Look at your breasts and nipples.  1. Take off all your clothes above your waist.  2. Stand in front of a mirror in a room with good lighting.  3. Put your hands on your hips and push your hands downward.  Feel your breasts.   1. Lie flat on your back or stand in the shower or tub. If you are in the shower or tub, have wet, soapy hands.  2. Place your right arm above your head.  3. Place your left hand in the right underarm area.  4. Make small circles using the pads (not the fingertips) of your 3 middle fingers. Press lightly and then with medium and firm pressure.  5. Move your fingers a little lower and make the small circles at the 3 pressures (light, medium, and firm).  6. Continue moving your fingers lower and making circles until you reach the bottom of your breast.  7. Move your fingers one finger-width towards the center of the body.  8. Continue making the circles, this time moving upward until you reach the bottom of your neck.  9. Move your fingers one finger-width towards the center of your body.  10. Make circles downward when starting at the bottom of the neck. Make circles upward when starting at the bottom of the breast. Stop when you reach the middle of the chest.  11.  Repeat these steps on the other breast.  Write down what looks and feels normal for each breast. Also write  down any changes you notice.  GET HELP RIGHT AWAY IF:  · You see any changes in your breasts or nipples.  · You see skin changes.  · You have unusual discharge from your nipples.  · You feel a new lump.  · You feel unusually thick areas.     This information is not intended to replace advice given to you by your health care provider. Make sure you discuss any questions you have with your health care provider.     Document Released: 02/15/2008 Document Revised: 08/15/2012 Document Reviewed: 12/14/2011  Elsevier Interactive Patient Education ©2016 [employer]

## 2015-09-29 NOTE — Progress Notes (Signed)
   Subjective:   55 y.o. Divorced Caucasian female presents for follow up of left breast tenderness and questionable mass or lymph node noted on 07/01/15. Patient had diagnostic 3D mammogram and [place] which showed fibroglandular changes and slight thickening in area of concern. Patient stopped caffeine use after office visit noting change. She also started on Vitamin E and Vitamin C. The tenderness resolved and the area noted she can not feel anymore. Previous breast surgery for lifts on both breasts.  Patient has not be able to come in due to mother being hospitalized with atrial fib complications, which have resolved. No breast concerns today.  Review of Systems Pertinent items are noted in HPI.   Objective:   General appearance: alert, cooperative and appears stated age Breasts: normal appearance, no masses or tenderness, No nipple retraction or dimpling, No nipple discharge or bleeding, No axillary or supraclavicular adenopathy, no mass noted in either breast. Area of concern in left breast, no mass or tenderness noted. Area was at left breast edge at 2 o'clock.   Assessment:   ASSESSMENT:Patient is diagnosed with normal breast exam and fibroglandular changes  Breast tenderness caffeine related which has stopped. Normal 3 D diagnostic mammogram and [place]   Plan:   PLAN: Reviewed findings with patient and stressed SBE monthly. Advise if any changes Routine mammogram screening as recommended.  RV prn

## 2015-10-05 NOTE — Progress Notes (Signed)
Reviewed personally.  M. Suzanne Renne Cornick, MD.  

## 2015-10-09 ENCOUNTER — Telehealth: Payer: Self-pay | Admitting: Emergency Medicine

## 2015-10-09 NOTE — Telephone Encounter (Signed)
-----   Message from Megan Salon, MD sent at 10/09/2015  6:27 AM EST ----- Regarding: RE: Mammogram hold  Yes, out of MMG hold.  MSM ----- Message -----    From: Michele Mcalpine, RN    Sent: 10/08/2015   5:35 PM      To: Megan Salon, MD Subject: Mammogram hold                                 Dr. Sabra Heck.  Melvia Heaps CNM patient in mammogram hold until Breast recheck appointment 09/29/15. Okay to remove from hold?

## 2015-10-09 NOTE — Telephone Encounter (Signed)
Out of hold per Dr. Miller.   

## 2015-10-20 ENCOUNTER — Ambulatory Visit: Payer: BLUE CROSS/BLUE SHIELD | Admitting: Certified Nurse Midwife

## 2015-10-20 ENCOUNTER — Encounter: Payer: Self-pay | Admitting: Certified Nurse Midwife

## 2015-10-20 ENCOUNTER — Telehealth: Payer: Self-pay | Admitting: Certified Nurse Midwife

## 2015-10-20 NOTE — Telephone Encounter (Signed)
Left message on voicemail regarding missed appointment and to reschedule.

## 2016-02-19 ENCOUNTER — Encounter: Payer: Self-pay | Admitting: Certified Nurse Midwife

## 2016-02-19 ENCOUNTER — Ambulatory Visit (INDEPENDENT_AMBULATORY_CARE_PROVIDER_SITE_OTHER): Payer: BLUE CROSS/BLUE SHIELD | Admitting: Certified Nurse Midwife

## 2016-02-19 VITALS — BP 118/70 | HR 72 | Resp 16 | Ht 63.5 in | Wt 128.0 lb

## 2016-02-19 DIAGNOSIS — Z01419 Encounter for gynecological examination (general) (routine) without abnormal findings: Secondary | ICD-10-CM

## 2016-02-19 DIAGNOSIS — Z0000 Encounter for general adult medical examination without abnormal findings: Secondary | ICD-10-CM

## 2016-02-19 DIAGNOSIS — Z124 Encounter for screening for malignant neoplasm of cervix: Secondary | ICD-10-CM | POA: Diagnosis not present

## 2016-02-19 LAB — LIPID PANEL
CHOL/HDL RATIO: 2.3 ratio (ref <=5.0)
Cholesterol: 197 mg/dL (ref 125–200)
HDL: 84 mg/dL (ref >=46)
LDL Cholesterol: 101 mg/dL (ref <130)
Triglycerides: 60 mg/dL (ref <150)
VLDL: 12 mg/dL (ref <30)

## 2016-02-19 LAB — COMPREHENSIVE METABOLIC PANEL
ALT: 23 U/L (ref 6–29)
AST: 20 U/L (ref 10–35)
Albumin: 4.3 g/dL (ref 3.6–5.1)
Alkaline Phosphatase: 58 U/L (ref 33–130)
BUN: 11 mg/dL (ref 7–25)
CHLORIDE: 104 mmol/L (ref 98–110)
CO2: 26 mmol/L (ref 20–31)
CREATININE: 0.82 mg/dL (ref 0.50–1.05)
Calcium: 9.6 mg/dL (ref 8.6–10.4)
Glucose, Bld: 87 mg/dL (ref 65–99)
Potassium: 4.8 mmol/L (ref 3.5–5.3)
SODIUM: 141 mmol/L (ref 135–146)
Total Bilirubin: 0.5 mg/dL (ref 0.2–1.2)
Total Protein: 6.9 g/dL (ref 6.1–8.1)

## 2016-02-19 LAB — TSH: TSH: 1.75 m[IU]/L

## 2016-02-19 LAB — CBC
HCT: 40.7 % (ref 35.0–45.0)
Hemoglobin: 13.6 g/dL (ref 11.7–15.5)
MCH: 31.3 pg (ref 27.0–33.0)
MCHC: 33.4 g/dL (ref 32.0–36.0)
MCV: 93.6 fL (ref 80.0–100.0)
MPV: 9 fL (ref 7.5–12.5)
PLATELETS: 272 10*3/uL (ref 140–400)
RBC: 4.35 MIL/uL (ref 3.80–5.10)
RDW: 13.3 % (ref 11.0–15.0)
WBC: 4.4 10*3/uL (ref 3.8–10.8)

## 2016-02-19 NOTE — Progress Notes (Signed)
55 y.o. G26P2002 Divorced  Caucasian Fe here for annual exam. Menopausal no HRT. Denies vaginal bleeding or vaginal dryness. No partner change, no STD screening. Sees Urgent care if needed. No health issues today. Desires screening labs. Planning beach vacation soon!  Patient's last menstrual period was 09/12/2010.          Sexually active: No.  The current method of family planning is BTL.    Exercising: Yes.    walking & hand weights Smoker:  no  Health Maintenance: Pap: 02-13-15 ASCUS HPV HR +, colpo 03-11-15 no dysplasia, pt was to have 25mth f/u but not done MMG:  07-07-15 bilateral & u/s left breast category b density, birads 1:neg Colonoscopy: 2016 normal f/u 29yrs BMD:   2012 TDaP:  2015 Shingles: no Pneumonia: no Hep C and HIV: HIV neg yrs ago Labs: none Self breast exam: done monthly    reports that she has quit smoking. She has never used smokeless tobacco. She reports that she drinks about 2.4 oz of alcohol per week. She reports that she does not use illicit drugs.  Past Medical History  Diagnosis Date  . Mitral valve prolapse 5 YRS AGO    NOT TREATED  . Heart murmur   . Fibroid   . Abnormal Pap smear of cervix     6/15 ASCUS HPV HR +    Past Surgical History  Procedure Laterality Date  . Tubal ligation    . Breast lift    . Uterine biopsies  03-14-11  . Endometrial biopsy      atypical glandular cells  . Dilation and curettage of uterus      secondary to atypical glandular cells  . Endometrial polyp      Current Outpatient Prescriptions  Medication Sig Dispense Refill  . DOXYCYCLINE PO Take 100 mg by mouth as needed.    Marland Kitchen METRONIDAZOLE, TOPICAL, 0.75 % LOTN   1   No current facility-administered medications for this visit.    Family History  Problem Relation Age of Onset  . Colon cancer Neg Hx   . Heart murmur Mother   . Atrial fibrillation Mother   . Hypertension Mother     ROS:  Pertinent items are noted in HPI.  Otherwise, a comprehensive ROS was  negative.  Exam:   BP 118/70 mmHg  Pulse 72  Resp 16  Ht 5' 3.5" (1.613 m)  Wt 128 lb (58.06 kg)  BMI 22.32 kg/m2  LMP 09/12/2010 Height: 5' 3.5" (161.3 cm) Ht Readings from Last 3 Encounters:  02/19/16 5' 3.5" (1.613 m)  09/29/15 5' 3.75" (1.619 m)  07/01/15 5' 3.75" (1.619 m)    General appearance: alert, cooperative and appears stated age Head: Normocephalic, without obvious abnormality, atraumatic Neck: no adenopathy, supple, symmetrical, trachea midline and thyroid normal to inspection and palpation Lungs: clear to auscultation bilaterally Breasts: normal appearance, no masses or tenderness, No nipple retraction or dimpling, No nipple discharge or bleeding, No axillary or supraclavicular adenopathy Heart: regular rate and rhythm Abdomen: soft, non-tender; no masses,  no organomegaly Extremities: extremities normal, atraumatic, no cyanosis or edema Skin: Skin color, texture, turgor normal. No rashes or lesions Lymph nodes: Cervical, supraclavicular, and axillary nodes normal. No abnormal inguinal nodes palpated Neurologic: Grossly normal   Pelvic: External genitalia:  no lesions              Urethra:  normal appearing urethra with no masses, tenderness or lesions  Bartholin's and Skene's: normal                 Vagina: normal appearing vagina with normal color and discharge, no lesions              Cervix: no cervical motion tenderness, no lesions and normal appearance with slight bleeding with pap onley              Pap taken: Yes.   Bimanual Exam:  Uterus:  normal size, contour, position, consistency, mobility, non-tender and anteverted              Adnexa: normal adnexa and no mass, fullness, tenderness               Rectovaginal: Confirms               Anus:  normal sphincter tone, no lesions  Chaperone present: yes  A:  Well Woman with normal exam  Menopausal no HRT  History of ASCUS pap with + HPVHR follow up pap today  Screening labs  P:    Reviewed health and wellness pertinent to exam  Aware of need to evaluate if vaginal bleeding  Repeat pap in one year if negative, if not pe results  [id number] panel, Hep. C CMP,TSH, Vitamin d, CBC  Pap smear as above with HPVHR   counseled on breast self exam, mammography screening, STD prevention, HIV risk factors and prevention, adequate intake of calcium and vitamin D, diet and exercise  return annually or prn  An After Visit Summary was printed and given to the patient.

## 2016-02-19 NOTE — Patient Instructions (Signed)

## 2016-02-20 LAB — HEPATITIS C ANTIBODY: HCV Ab: NEGATIVE

## 2016-02-20 LAB — VITAMIN D 25 HYDROXY (VIT D DEFICIENCY, FRACTURES): VIT D 25 HYDROXY: 36 ng/mL (ref 30–100)

## 2016-02-24 LAB — IPS PAP TEST WITH HPV

## 2016-06-29 ENCOUNTER — Ambulatory Visit (INDEPENDENT_AMBULATORY_CARE_PROVIDER_SITE_OTHER): Payer: BLUE CROSS/BLUE SHIELD | Admitting: Certified Nurse Midwife

## 2016-06-29 ENCOUNTER — Encounter: Payer: Self-pay | Admitting: Certified Nurse Midwife

## 2016-06-29 VITALS — BP 120/70 | HR 72 | Temp 97.6°F | Resp 16 | Ht 63.5 in | Wt 130.0 lb

## 2016-06-29 DIAGNOSIS — Z113 Encounter for screening for infections with a predominantly sexual mode of transmission: Secondary | ICD-10-CM | POA: Diagnosis not present

## 2016-06-29 DIAGNOSIS — N390 Urinary tract infection, site not specified: Secondary | ICD-10-CM | POA: Diagnosis not present

## 2016-06-29 DIAGNOSIS — R319 Hematuria, unspecified: Secondary | ICD-10-CM | POA: Diagnosis not present

## 2016-06-29 LAB — POCT URINALYSIS DIPSTICK
BILIRUBIN UA: NEGATIVE
Glucose, UA: NEGATIVE
KETONES UA: NEGATIVE
Leukocytes, UA: NEGATIVE
NITRITE UA: NEGATIVE
PH UA: 5
Protein, UA: NEGATIVE
Urobilinogen, UA: NEGATIVE

## 2016-06-29 LAB — HIV ANTIBODY (ROUTINE TESTING W REFLEX): HIV: NONREACTIVE

## 2016-06-29 NOTE — Progress Notes (Signed)
55 y.o. Divorced Caucasian female G2P2002 here with complaint of UTI, with onset  on 2-3 days. Patient complaining of urinary urgency with urination until took her sister's antibiotic ? Name for 8 days. Symptoms of slight urgency only.. Patient denies fever, chills, nausea or back pain. No new personal products. Patient feels related  to sexual activity. Denies vaginal symptoms.     Menopausal with slight vaginal dryness, uses OTC product with good results. Patient consuming adequate water intake. Also STD concerns and would like testing today.. No other health issues.   O: Healthy female WDWN Affect: Normal, orientation x 3 Skin : warm and dry CVAT: negative bilateral Abdomen: very slight for suprapubic tenderness  Pelvic exam: External genital area: normal, no lesions Bladder very slight tenderness,Urethra non tender, Urethral meatus: non tender, no redness Vagina: normal vaginal discharge, normal appearance  Wet prep  Taken, specimens obtained Cervix: normal, non tender Uterus:normal,non tender Adnexa: normal non tender, no fullness or masses   A: R/O UTI ? Post coital Normal pelvic exam poct urine-rbc tr STD screening  P: Reviewed findings of UTI and need for treatment. Rx: will hold until micro back, patient will increase water and use her Pyridium. Discussed post coital treatment after culture is back and if treatment is needed with her symptoms now. [id number] micro, culture Reviewed warning signs and symptoms of UTI and need to advise if occurring. Encouraged to limit soda, tea, and coffee and be sure to increase water intake. Discussed STD prevention with condom use. Labs: GC,Chlamydia, HIV,RPR,    RV prn

## 2016-06-29 NOTE — Progress Notes (Signed)
Encounter reviewed Jill Jertson, MD   

## 2016-06-29 NOTE — Patient Instructions (Signed)

## 2016-06-30 ENCOUNTER — Other Ambulatory Visit: Payer: Self-pay | Admitting: Certified Nurse Midwife

## 2016-06-30 DIAGNOSIS — B9689 Other specified bacterial agents as the cause of diseases classified elsewhere: Secondary | ICD-10-CM

## 2016-06-30 DIAGNOSIS — N760 Acute vaginitis: Principal | ICD-10-CM

## 2016-06-30 LAB — WET PREP BY MOLECULAR PROBE
CANDIDA SPECIES: NEGATIVE
Gardnerella vaginalis: POSITIVE — AB
TRICHOMONAS VAG: NEGATIVE

## 2016-06-30 LAB — GC/CHLAMYDIA PROBE AMP
CT Probe RNA: NOT DETECTED
GC PROBE AMP APTIMA: NOT DETECTED

## 2016-06-30 LAB — URINALYSIS, MICROSCOPIC ONLY
Bacteria, UA: NONE SEEN [HPF]
Casts: NONE SEEN [LPF]
Crystals: NONE SEEN [HPF]
RBC / HPF: NONE SEEN RBC/HPF (ref <=2)
SQUAMOUS EPITHELIAL / LPF: NONE SEEN [HPF] (ref <=5)
WBC UA: NONE SEEN WBC/HPF (ref <=5)
Yeast: NONE SEEN [HPF]

## 2016-06-30 LAB — RPR

## 2016-06-30 MED ORDER — METRONIDAZOLE 0.75 % VA GEL: 1.0000 | Freq: Two times a day (BID) | VAGINAL | 0 refills | Status: DC

## 2016-07-01 ENCOUNTER — Other Ambulatory Visit: Payer: Self-pay

## 2016-07-01 LAB — URINE CULTURE: Organism ID, Bacteria: NO GROWTH

## 2016-07-01 MED ORDER — NITROFURANTOIN MONOHYD MACRO 100 MG PO CAPS: ORAL_CAPSULE | ORAL | 1 refills | Status: DC

## 2017-02-22 NOTE — Progress Notes (Signed)
56 y.o. G36P2002 Divorced  Caucasian Fe here for annual exam. Menopausal no HRT. Denies vaginal bleeding or vaginal dryness. 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 Feels she has may have outbreak now on vulva. Does not have Valtrex for use. Would like treatment for. Busy with grandchildren. Sees Urgent care if needed. No visits in past year. No other health issues today. Screening labs if needed. Planning another vacation!  Patient's last menstrual period was 09/12/2010.          Sexually active: No.  The current method of family planning is tubal ligation.    Exercising: Yes.    walking Smoker:  no  Health Maintenance: Pap:  02-13-15 ASCUS HPV HR +, 02-19-16 neg HPV HR neg History of Abnormal Pap: colpo 2016 MMG:  Bilateral & left breast u/s 10/16 category b density birads 1:neg Self Breast exams: yes Colonoscopy:  2016 normal f/u 79yrs BMD:   2012 TDaP:  2015 Shingles: no Pneumonia: no Hep C and HIV: both neg 2017 Labs: none   reports that she has quit smoking. She has never used smokeless tobacco. She reports that she drinks about 2.4 - 3.0 oz of alcohol per week . She reports that she does not use drugs.  Past Medical History:  Diagnosis Date  . Abnormal Pap smear of cervix    6/15 ASCUS HPV HR +  . Fibroid   . Heart murmur   . Mitral valve prolapse 5 YRS AGO   NOT TREATED    Past Surgical History:  Procedure Laterality Date  . BREAST LIFT    . DILATION AND CURETTAGE OF UTERUS     secondary to atypical glandular cells  . ENDOMETRIAL BIOPSY     atypical glandular cells  . endometrial polyp    . TUBAL LIGATION    . UTERINE BIOPSIES  03-14-11    No current outpatient prescriptions on file.   No current facility-administered medications for this visit.     Family History  Problem Relation Age of Onset  . Colon cancer Neg Hx   . Heart murmur Mother   . Atrial fibrillation Mother   . Hypertension Mother   . Prostate cancer Father      ROS:  Pertinent items are noted in HPI.  Otherwise, a comprehensive ROS was negative.  Exam:   BP 120/70   Pulse 70   Resp 16   Ht 5' 3.25" (1.607 m)   Wt 128 lb (58.1 kg)   LMP 09/12/2010   BMI 22.50 kg/m  Height: 5' 3.25" (160.7 cm) Ht Readings from Last 3 Encounters:  02/23/17 5' 3.25" (1.607 m)  06/29/16 5' 3.5" (1.613 m)  02/19/16 5' 3.5" (1.613 m)    General appearance: alert, cooperative and appears stated age Head: Normocephalic, without obvious abnormality, atraumatic Neck: no adenopathy, supple, symmetrical, trachea midline and thyroid normal to inspection and palpation Lungs: clear to auscultation bilaterally Breasts: normal appearance, no masses or tenderness, No nipple retraction or dimpling, No nipple discharge or bleeding, No axillary or supraclavicular adenopathy, breast augmentation scarring noted Heart: regular rate and rhythm Abdomen: soft, non-tender; no masses,  no organomegaly Extremities: extremities normal, atraumatic, no cyanosis or edema Skin: Skin color, texture, turgor normal. No rashes or lesions Lymph nodes: Cervical, supraclavicular, and axillary nodes normal. No abnormal inguinal nodes palpated Neurologic: Grossly normal   Pelvic: External genitalia:  Normal female with small HSV blister noted on right vulva at 9 o'clock, slightly tender, not open  Urethra:  normal appearing urethra with no masses, tenderness or lesions              Bartholin's and Skene's: normal                 Vagina: normal appearing vagina with normal color and discharge, no lesions              Cervix: multiparous appearance, no cervical motion tenderness and no lesions              Pap taken: Yes.   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 Also discuss etiology of HSV and can be sexually transmitted. Discussed suppressive therapy if needed. Patient will advise if she has another outbreak and may want to do suppression. Questions addressed.  Rx Valtrex see order with instructions  Lab: Vitamin D   Discussed importance of mammogram screening and will schedule for prior to leaving today.  Pap smear: yes   counseled on breast self exam, mammography screening, adequate intake of calcium and vitamin D, diet and exercise, Encouraged flu vaccine in the fall  return annually or prn  An After Visit Summary was printed and given to the patient.

## 2017-02-23 ENCOUNTER — Ambulatory Visit (INDEPENDENT_AMBULATORY_CARE_PROVIDER_SITE_OTHER): Payer: No Typology Code available for payment source | Admitting: Certified Nurse Midwife

## 2017-02-23 ENCOUNTER — Other Ambulatory Visit (HOSPITAL_COMMUNITY)
Admission: RE | Admit: 2017-02-23 | Discharge: 2017-02-23 | Disposition: A | Discharge status: Home / Self Care | Payer: BLUE CROSS/BLUE SHIELD | Source: Ambulatory Visit | Attending: Obstetrics & Gynecology | Admitting: Obstetrics & Gynecology

## 2017-02-23 ENCOUNTER — Encounter: Payer: Self-pay | Admitting: Certified Nurse Midwife

## 2017-02-23 VITALS — BP 120/70 | HR 70 | Resp 16 | Ht 63.25 in | Wt 128.0 lb

## 2017-02-23 DIAGNOSIS — Z124 Encounter for screening for malignant neoplasm of cervix: Secondary | ICD-10-CM

## 2017-02-23 DIAGNOSIS — A6009 Herpesviral infection of other urogenital tract: Secondary | ICD-10-CM | POA: Diagnosis not present

## 2017-02-23 DIAGNOSIS — Z1231 Encounter for screening mammogram for malignant neoplasm of breast: Secondary | ICD-10-CM | POA: Diagnosis not present

## 2017-02-23 DIAGNOSIS — Z01419 Encounter for gynecological examination (general) (routine) without abnormal findings: Secondary | ICD-10-CM

## 2017-02-23 DIAGNOSIS — Z0000 Encounter for general adult medical examination without abnormal findings: Secondary | ICD-10-CM

## 2017-02-23 MED ORDER — VALACYCLOVIR HCL 500 MG PO TABS: ORAL_TABLET | ORAL | 12 refills | Status: DC

## 2017-02-23 NOTE — Patient Instructions (Signed)

## 2017-02-23 NOTE — Progress Notes (Signed)
Patient scheduled while in office for screening MMG. Spoke with Cherish at [facility]. Patient scheduled for 03/08/17 at 3:30pm. Patient is agreeable to date and time.

## 2017-02-24 LAB — VITAMIN D 25 HYDROXY (VIT D DEFICIENCY, FRACTURES): VIT D 25 HYDROXY: 33.1 ng/mL (ref 30.0–100.0)

## 2017-02-28 LAB — CYTOLOGY - PAP: DIAGNOSIS: NEGATIVE

## 2017-03-08 ENCOUNTER — Ambulatory Visit
Admission: RE | Admit: 2017-03-08 | Discharge: 2017-03-08 | Disposition: A | Discharge status: Home / Self Care | Payer: No Typology Code available for payment source | Source: Ambulatory Visit | Attending: Certified Nurse Midwife | Admitting: Certified Nurse Midwife

## 2017-03-08 DIAGNOSIS — Z1231 Encounter for screening mammogram for malignant neoplasm of breast: Secondary | ICD-10-CM

## 2018-02-28 ENCOUNTER — Ambulatory Visit: Payer: No Typology Code available for payment source | Admitting: Certified Nurse Midwife

## 2018-03-29 NOTE — Progress Notes (Signed)
57 y.o. G34P2002 Divorced  Caucasian Fe here for annual exam. Menopausal no vaginal bleeding or vaginal dryness. Sees Urgent care if needed. Not sexually active. 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 Coping with her help from her other 3 siblings assistance. Not sleeping at times, but feels it is from exhaustion at times. Trying to eat well, only 2 pound weight loss now. Occasional HSV outbreaks needs Rx update. Sees Urgent Care if needed. No other health issues today. Screening labs declined today.  Patient's last menstrual period was 09/12/2010.          Sexually active: No.  The current method of family planning is tubal ligation.    Exercising: Yes.    walk, yoga Smoker:  no  Health Maintenance: Pap:  02-19-16 neg HPV HR neg, 02-23-17 neg History of Abnormal Pap: yes LEEP yrs ago MMG:  03-08-17 category c density birads 1:neg Self Breast exams: yes Colonoscopy:  2016 f/u 41yrs BMD:   2012 TDaP:  2015 Shingles: not done Pneumonia: not done Hep C and HIV: both neg 2017 Labs: declines today.   reports that she has quit smoking. She has never used smokeless tobacco. She reports that she drinks about 1.2 - 1.8 oz of alcohol per week. She reports that she does not use drugs.  Past Medical History:  Diagnosis Date  . Abnormal Pap smear of cervix    6/15 ASCUS HPV HR +  . Fibroid   . Heart murmur   . Mitral valve prolapse 5 YRS AGO   NOT TREATED    Past Surgical History:  Procedure Laterality Date  . BREAST LIFT    . DILATION AND CURETTAGE OF UTERUS     secondary to atypical glandular cells  . ENDOMETRIAL BIOPSY     atypical glandular cells  . endometrial polyp    . TUBAL LIGATION    . UTERINE BIOPSIES  03-14-11    Current Outpatient Medications  Medication Sig Dispense Refill  . valACYclovir (VALTREX) 500 MG tablet Take one tablet bid for 10 days and then stop. Take bid x 3 day with onset of another outbreak only 30 tablet 12   No  current facility-administered medications for this visit.     Family History  Problem Relation Age of Onset  . Heart murmur Mother   . Atrial fibrillation Mother   . Hypertension Mother   . Colon cancer Mother   . Prostate cancer Father     ROS:  Pertinent items are noted in HPI.  Otherwise, a comprehensive ROS was negative.  Exam:   BP 110/70   Pulse 64   Resp 16   Ht 5' 3.5" (1.613 m)   Wt 126 lb (57.2 kg)   LMP 09/12/2010   BMI 21.97 kg/m  Height: 5' 3.5" (161.3 cm) Ht Readings from Last 3 Encounters:  03/30/18 5' 3.5" (1.613 m)  02/23/17 5' 3.25" (1.607 m)  06/29/16 5' 3.5" (1.613 m)    General appearance: alert, cooperative and appears stated age Head: Normocephalic, without obvious abnormality, atraumatic Neck: no adenopathy, supple, symmetrical, trachea midline and thyroid normal to inspection and palpation Lungs: clear to auscultation bilaterally Breasts: normal appearance, no masses or tenderness, No nipple retraction or dimpling, No nipple discharge or bleeding, No axillary or supraclavicular adenopathy Heart: regular rate and rhythm Abdomen: soft, non-tender; no masses,  no organomegaly Extremities: extremities normal, atraumatic, no cyanosis or edema Skin: Skin color, texture, turgor normal. No rashes or lesions Lymph nodes:  Cervical, supraclavicular, and axillary nodes normal. No abnormal inguinal nodes palpated Neurologic: Grossly normal   Pelvic: External genitalia:  no lesions              Urethra:  normal appearing urethra with no masses, tenderness or lesions              Bartholin's and Skene's: normal                 Vagina: normal appearing vagina with normal color and discharge, no lesions              Cervix: multiparous appearance, no cervical motion tenderness and no lesions              Pap taken: Yes.   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     Discussed time for self as possible. Discussed Hospice use for care with parents if available. Patient given information to call in [facility]. Questions addressed.  Rx Valtrex see order with instructions  Pap smear: no   counseled on breast self exam, mammography screening, menopause, adequate intake of calcium and vitamin D, diet and exercise  return annually or prn  An After Visit Summary was printed and given to the patient.

## 2018-03-30 ENCOUNTER — Encounter: Payer: Self-pay | Admitting: Certified Nurse Midwife

## 2018-03-30 ENCOUNTER — Other Ambulatory Visit: Payer: Self-pay

## 2018-03-30 ENCOUNTER — Ambulatory Visit (INDEPENDENT_AMBULATORY_CARE_PROVIDER_SITE_OTHER): Payer: PRIVATE HEALTH INSURANCE | Admitting: Certified Nurse Midwife

## 2018-03-30 VITALS — BP 110/70 | HR 64 | Resp 16 | Ht 63.5 in | Wt 126.0 lb

## 2018-03-30 DIAGNOSIS — Z01419 Encounter for gynecological examination (general) (routine) without abnormal findings: Secondary | ICD-10-CM | POA: Diagnosis not present

## 2018-03-30 DIAGNOSIS — A6009 Herpesviral infection of other urogenital tract: Secondary | ICD-10-CM | POA: Diagnosis not present

## 2018-03-30 DIAGNOSIS — H938X1 Other specified disorders of right ear: Secondary | ICD-10-CM | POA: Insufficient documentation

## 2018-03-30 DIAGNOSIS — N951 Menopausal and female climacteric states: Secondary | ICD-10-CM | POA: Diagnosis not present

## 2018-03-30 DIAGNOSIS — Z659 Problem related to unspecified psychosocial circumstances: Secondary | ICD-10-CM

## 2018-03-30 MED ORDER — VALACYCLOVIR HCL 500 MG PO TABS: ORAL_TABLET | ORAL | 12 refills | Status: DC

## 2018-03-30 NOTE — Patient Instructions (Signed)

## 2018-04-30 ENCOUNTER — Other Ambulatory Visit: Payer: Self-pay | Admitting: Certified Nurse Midwife

## 2018-04-30 DIAGNOSIS — Z1231 Encounter for screening mammogram for malignant neoplasm of breast: Secondary | ICD-10-CM

## 2018-05-25 ENCOUNTER — Ambulatory Visit: Payer: No Typology Code available for payment source

## 2018-06-19 ENCOUNTER — Inpatient Hospital Stay: Admission: RE | Admit: 2018-06-19 | Payer: No Typology Code available for payment source | Source: Ambulatory Visit

## 2018-06-20 ENCOUNTER — Ambulatory Visit
Admission: RE | Admit: 2018-06-20 | Discharge: 2018-06-20 | Disposition: A | Discharge status: Home / Self Care | Payer: PRIVATE HEALTH INSURANCE | Source: Ambulatory Visit | Attending: Certified Nurse Midwife | Admitting: Certified Nurse Midwife

## 2018-06-20 DIAGNOSIS — Z1231 Encounter for screening mammogram for malignant neoplasm of breast: Secondary | ICD-10-CM

## 2019-04-02 ENCOUNTER — Other Ambulatory Visit: Payer: Self-pay

## 2019-04-03 ENCOUNTER — Ambulatory Visit: Payer: PRIVATE HEALTH INSURANCE | Admitting: Certified Nurse Midwife

## 2019-04-03 ENCOUNTER — Encounter: Payer: Self-pay | Admitting: Certified Nurse Midwife

## 2019-04-03 NOTE — Progress Notes (Deleted)
58 y.o. [id number] Divorced  {Race/ethnicity:17218} Fe here for annual exam.    Patient's last menstrual period was 09/12/2010.          Sexually active: {yes no:314532}  The current method of family planning is tubal ligation.    Exercising: {yes no:314532}  {types:19826} Smoker:  {YES NO:22349}  ROS  Health Maintenance: Pap:  02-23-17 neg History of Abnormal Pap: LEEP yrs ago MMG:  06-20-18 category c density birads 1:neg Self Breast exams: {YES NO:22349} Colonoscopy:  2016 f/u 25yrs BMD:   2012 TDaP:  2015 Shingles: *** Pneumonia: *** Hep C and HIV: both neg 2017 Labs: ***   reports that she has quit smoking. She has never used smokeless tobacco. She reports current alcohol use of about 2.0 - 3.0 standard drinks of alcohol per week. She reports that she does not use drugs.  Past Medical History:  Diagnosis Date  . Abnormal Pap smear of cervix    6/15 ASCUS HPV HR +  . Fibroid   . Heart murmur   . Mitral valve prolapse 5 YRS AGO   NOT TREATED    Past Surgical History:  Procedure Laterality Date  . BREAST LIFT    . CERVICAL BIOPSY  W/ LOOP ELECTRODE EXCISION    . DILATION AND CURETTAGE OF UTERUS     secondary to atypical glandular cells  . ENDOMETRIAL BIOPSY     atypical glandular cells  . endometrial polyp    . TUBAL LIGATION    . UTERINE BIOPSIES  03-14-11    Current Outpatient Medications  Medication Sig Dispense Refill  . valACYclovir (VALTREX) 500 MG tablet Take bid x 3 day with onset of  outbreak 30 tablet 12   No current facility-administered medications for this visit.     Family History  Problem Relation Age of Onset  . Heart murmur Mother   . Atrial fibrillation Mother   . Hypertension Mother   . Colon cancer Mother   . Prostate cancer Father     ROS:  Pertinent items are noted in HPI.  Otherwise, a comprehensive ROS was negative.  Exam:   LMP 09/12/2010    Ht Readings from Last 3 Encounters:  03/30/18 5' 3.5" (1.613 m)  02/23/17 5' 3.25"  (1.607 m)  06/29/16 5' 3.5" (1.613 m)    General appearance: alert, cooperative and appears stated age Head: Normocephalic, without obvious abnormality, atraumatic Neck: no adenopathy, supple, symmetrical, trachea midline and thyroid {EXAM; THYROID:18604} Lungs: clear to auscultation bilaterally Breasts: {Exam; breast:13139::"normal appearance, no masses or tenderness"} Heart: regular rate and rhythm Abdomen: soft, non-tender; no masses,  no organomegaly Extremities: extremities normal, atraumatic, no cyanosis or edema Skin: Skin color, texture, turgor normal. No rashes or lesions Lymph nodes: Cervical, supraclavicular, and axillary nodes normal. No abnormal inguinal nodes palpated Neurologic: Grossly normal   Pelvic: External genitalia:  no lesions              Urethra:  normal appearing urethra with no masses, tenderness or lesions              Bartholin's and Skene's: normal                 Vagina: normal appearing vagina with normal color and discharge, no lesions              Cervix: {exam; cervix:14595}              Pap taken: {yes no:314532} Bimanual Exam:  Uterus:  {exam; uterus:12215}  Adnexa: {exam; adnexa:12223}               Rectovaginal: Confirms               Anus:  normal sphincter tone, no lesions  Chaperone present: ***  A:  Well Woman with normal exam  P:   Reviewed health and wellness pertinent to exam  Pap smear: {YES NO:22349}  {plan; gyn:5269::"mammogram","pap smear","return annually or prn"}  An After Visit Summary was printed and given to the patient.

## 2019-11-06 NOTE — Progress Notes (Signed)
59 y.o. G28P2002 Divorced  Caucasian Fe here for annual exam. Post menopausal denies vaginal bleeding or vaginal dryness. Parents passed away in the past two years Has been very busy with providing care for them. She had shingles outbreak, was seen by Urgent clinic and treated with Valtrex. Cleared, no issues since occurrence. Has had no HSV outbreaks. Needs Valtrex update. Did not have aex exam last year due to  and being care provider for parents and their deaths. She seek Hospice care which was very helpful.  Needs to establish with PCP, requests referral list. No other health issues today.  Patient's last menstrual period was 09/12/2010.          Sexually active: No.  The current method of family planning is tubal ligation.    Exercising: No.  The patient does not participate in regular exercise at present. Smoker:  Former smoker   Review of Systems  Constitutional: Negative.   HENT: Negative.   Eyes: Negative.   Respiratory: Negative.   Cardiovascular: Negative.   Gastrointestinal: Negative.   Genitourinary: Negative.   Musculoskeletal: Negative.   Skin: Negative.   Neurological: Negative.   Endo/Heme/Allergies: Negative.   Psychiatric/Behavioral: Negative.     Health Maintenance: Pap:  02-19-16 neg HPV HR neg, 02-23-17 neg History of Abnormal Pap: LEEP MMG:  06-20-18 category c density birads 1:neg Self Breast exams: yes Colonoscopy:  2016 f/u 60yrs BMD:   2012 TDaP:  2015 Shingles: 2019  Pneumonia: no Hep C and HIV: both neg 2017 Labs: yes   reports that she has quit smoking. She has never used smokeless tobacco. She reports current alcohol use of about 2.0 - 3.0 standard drinks of alcohol per week. She reports that she does not use drugs.  Past Medical History:  Diagnosis Date  . Abnormal Pap smear of cervix    6/15 ASCUS HPV HR +  . Fibroid   . Heart murmur   . Mitral valve prolapse 5 YRS AGO   NOT TREATED    Past Surgical History:  Procedure Laterality Date  .  BREAST LIFT    . CERVICAL BIOPSY  W/ LOOP ELECTRODE EXCISION    . DILATION AND CURETTAGE OF UTERUS     secondary to atypical glandular cells  . ENDOMETRIAL BIOPSY     atypical glandular cells  . endometrial polyp    . TUBAL LIGATION    . UTERINE BIOPSIES  03-14-11    Current Outpatient Medications  Medication Sig Dispense Refill  . valACYclovir (VALTREX) 500 MG tablet Take bid x 3 day with onset of  outbreak 30 tablet 12   No current facility-administered medications for this visit.    Family History  Problem Relation Age of Onset  . Heart murmur Mother   . Atrial fibrillation Mother   . Hypertension Mother   . Colon cancer Mother   . Prostate cancer Father     ROS:  Pertinent items are noted in HPI.  Otherwise, a comprehensive ROS was negative.  Exam:   BP 120/80 (BP Location: Right Arm, Patient Position: Sitting, Cuff Size: Normal)   Pulse 72   Temp (!) 97.2 F (36.2 C) (Skin)   Resp 14   Ht 5\' 3"  (1.6 m)   Wt 128 lb 8 oz (58.3 kg)   LMP 09/12/2010   BMI 22.76 kg/m  Height: 5\' 3"  (160 cm) Ht Readings from Last 3 Encounters:  11/11/19 5\' 3"  (1.6 m)  03/30/18 5' 3.5" (1.613 m)  02/23/17 5' 3.25" (  1.607 m)    General appearance: alert, cooperative and appears stated age Head: Normocephalic, without obvious abnormality, atraumatic Neck: no adenopathy, supple, symmetrical, trachea midline and thyroid normal to inspection and palpation Lungs: clear to auscultation bilaterally Breasts: normal appearance, no masses or tenderness, No nipple retraction or dimpling, No nipple discharge or bleeding, No axillary or supraclavicular adenopathy Heart: regular rate and rhythm Abdomen: soft, non-tender; no masses,  no organomegaly Extremities: extremities normal, atraumatic, no cyanosis or edema Skin: Skin color, texture, turgor normal. No rashes or lesions Lymph nodes: Cervical, supraclavicular, and axillary nodes normal. No abnormal inguinal nodes palpated Neurologic:  Grossly normal   Pelvic: External genitalia:  no lesions              Urethra:  normal appearing urethra with no masses, tenderness or lesions              Bartholin's and Skene's: normal                 Vagina: normal appearing vagina with normal color and discharge, no lesions              Cervix: no cervical motion tenderness, no lesions and normal appearance              Pap taken: Yes.   Bimanual Exam:  Uterus:  normal size, contour, position, consistency, mobility, non-tender and mid position              Adnexa: normal adnexa and no mass, fullness, tenderness               Rectovaginal: Confirms               Anus:  normal sphincter tone, no lesions  Chaperone present: yes  A:  Well Woman with normal exam  Post menopausal no HRT  Shingles occurrence past year  History of HSV, no outbreaks  Social Stress with parents  Screening labs  P:   Reviewed health and wellness pertinent to exam  Aware of need to advise if vaginal bleeding  Patient plans to take vaccine due to occurrence  Rx Valtrex see order with instructions  Encouraged to seek family and friend support as needed  Labs: CBC, CMP, Lipid panel, TSH, Vitamin D  Pap smear: yes   counseled on breast self exam, mammography screening, feminine hygiene, menopause, osteoporosis, adequate intake of calcium and vitamin D, diet and exercise, Kegel's exercises return annually or prn  An After Visit Summary was printed and given to the patient.

## 2019-11-11 ENCOUNTER — Other Ambulatory Visit (HOSPITAL_COMMUNITY)
Admission: RE | Admit: 2019-11-11 | Discharge: 2019-11-11 | Disposition: A | Discharge status: Home / Self Care | Payer: PRIVATE HEALTH INSURANCE | Source: Ambulatory Visit | Attending: Obstetrics & Gynecology | Admitting: Obstetrics & Gynecology

## 2019-11-11 ENCOUNTER — Ambulatory Visit (INDEPENDENT_AMBULATORY_CARE_PROVIDER_SITE_OTHER): Payer: PRIVATE HEALTH INSURANCE | Admitting: Certified Nurse Midwife

## 2019-11-11 ENCOUNTER — Encounter: Payer: Self-pay | Admitting: Certified Nurse Midwife

## 2019-11-11 ENCOUNTER — Other Ambulatory Visit: Payer: Self-pay

## 2019-11-11 VITALS — BP 120/80 | HR 72 | Temp 97.2°F | Resp 14 | Ht 63.0 in | Wt 128.5 lb

## 2019-11-11 DIAGNOSIS — Z124 Encounter for screening for malignant neoplasm of cervix: Secondary | ICD-10-CM | POA: Insufficient documentation

## 2019-11-11 DIAGNOSIS — E559 Vitamin D deficiency, unspecified: Secondary | ICD-10-CM

## 2019-11-11 DIAGNOSIS — Z01419 Encounter for gynecological examination (general) (routine) without abnormal findings: Secondary | ICD-10-CM

## 2019-11-11 DIAGNOSIS — Z0000 Encounter for general adult medical examination without abnormal findings: Secondary | ICD-10-CM | POA: Diagnosis not present

## 2019-11-11 DIAGNOSIS — Z780 Asymptomatic menopausal state: Secondary | ICD-10-CM

## 2019-11-11 NOTE — Patient Instructions (Signed)

## 2019-11-12 LAB — LIPID PANEL
Chol/HDL Ratio: 3.1 ratio (ref 0.0–4.4)
Cholesterol, Total: 198 mg/dL (ref 100–199)
HDL: 63 mg/dL (ref >39)
LDL Chol Calc (NIH): 118 mg/dL — ABNORMAL HIGH (ref 0–99)
Triglycerides: 93 mg/dL (ref 0–149)
VLDL Cholesterol Cal: 17 mg/dL (ref 5–40)

## 2019-11-12 LAB — CBC
Hematocrit: 39.5 % (ref 34.0–46.6)
Hemoglobin: 13.2 g/dL (ref 11.1–15.9)
MCH: 31.7 pg (ref 26.6–33.0)
MCHC: 33.4 g/dL (ref 31.5–35.7)
MCV: 95 fL (ref 79–97)
Platelets: 237 10*3/uL (ref 150–450)
RBC: 4.16 x10E6/uL (ref 3.77–5.28)
RDW: 12.2 % (ref 11.7–15.4)
WBC: 3.8 10*3/uL (ref 3.4–10.8)

## 2019-11-12 LAB — COMPREHENSIVE METABOLIC PANEL
ALT: 21 IU/L (ref 0–32)
AST: 22 IU/L (ref 0–40)
Albumin/Globulin Ratio: 1.8 (ref 1.2–2.2)
Albumin: 4.6 g/dL (ref 3.8–4.8)
Alkaline Phosphatase: 71 IU/L (ref 39–117)
BUN/Creatinine Ratio: 31 — ABNORMAL HIGH (ref 12–28)
BUN: 24 mg/dL (ref 8–27)
Bilirubin Total: 0.3 mg/dL (ref 0.0–1.2)
CO2: 26 mmol/L (ref 20–29)
Calcium: 9.3 mg/dL (ref 8.7–10.3)
Chloride: 100 mmol/L (ref 96–106)
Creatinine, Ser: 0.77 mg/dL (ref 0.57–1.00)
GFR calc Af Amer: 96 mL/min/{1.73_m2} (ref >59)
GFR calc non Af Amer: 84 mL/min/{1.73_m2} (ref >59)
Globulin, Total: 2.5 g/dL (ref 1.5–4.5)
Glucose: 82 mg/dL (ref 65–99)
Potassium: 4 mmol/L (ref 3.5–5.2)
Sodium: 138 mmol/L (ref 134–144)
Total Protein: 7.1 g/dL (ref 6.0–8.5)

## 2019-11-12 LAB — VITAMIN D 25 HYDROXY (VIT D DEFICIENCY, FRACTURES): Vit D, 25-Hydroxy: 31.1 ng/mL (ref 30.0–100.0)

## 2019-11-12 LAB — TSH: TSH: 2.71 u[IU]/mL (ref 0.450–4.500)

## 2019-11-13 ENCOUNTER — Telehealth: Payer: Self-pay

## 2019-11-13 NOTE — Telephone Encounter (Signed)
-----   Message from Regina Eck, CNM sent at 11/13/2019  8:14 AM EST ----- Notify patient her TSH is normal CBC is normal Lipid panel normal cholesterol at 198, LDL slightly elevated at 118, work on decrease in cholesterol foods, increase fiber, lean meat and fish in diet Risk ratio is normal Liver , kidney and glucose profile essentially normal Vitamin D is normal but needs to be in 40-50 ratio for bone support in menopause Start on OTC Vitamin D 3 1000 IU daily

## 2019-11-13 NOTE — Telephone Encounter (Signed)
Patient returned a missed call from [place].

## 2019-11-13 NOTE — Telephone Encounter (Signed)
Patient notified of results as written by provider 

## 2019-11-13 NOTE — Telephone Encounter (Signed)
Mailbox full. Try again. 

## 2019-11-20 LAB — CYTOLOGY - PAP
Comment: NEGATIVE
Comment: NEGATIVE
Diagnosis: NEGATIVE
HPV 16: NEGATIVE
HPV 18 / 45: POSITIVE — AB
High risk HPV: POSITIVE — AB

## 2019-11-25 ENCOUNTER — Other Ambulatory Visit: Payer: Self-pay | Admitting: *Deleted

## 2019-11-25 DIAGNOSIS — R87810 Cervical high risk human papillomavirus (HPV) DNA test positive: Secondary | ICD-10-CM

## 2019-11-25 DIAGNOSIS — Z8742 Personal history of other diseases of the female genital tract: Secondary | ICD-10-CM

## 2019-11-26 ENCOUNTER — Telehealth: Payer: Self-pay | Admitting: Certified Nurse Midwife

## 2019-11-26 NOTE — Telephone Encounter (Signed)
Call to patient, mailbox full, unable to leave message.

## 2019-11-26 NOTE — Telephone Encounter (Signed)
Call placed to convey benefits for colposcopy. °

## 2019-11-26 NOTE — Telephone Encounter (Signed)
Patient returned my call and I conveyed the benefits. Patient understands/agreeable with the benefits. Patient would like to speak with the nurse for more clarification on the procedure.

## 2019-11-27 ENCOUNTER — Encounter: Payer: Self-pay | Admitting: Certified Nurse Midwife

## 2019-11-27 ENCOUNTER — Ambulatory Visit (INDEPENDENT_AMBULATORY_CARE_PROVIDER_SITE_OTHER): Payer: PRIVATE HEALTH INSURANCE | Admitting: Certified Nurse Midwife

## 2019-11-27 ENCOUNTER — Other Ambulatory Visit (HOSPITAL_COMMUNITY)
Admission: RE | Admit: 2019-11-27 | Discharge: 2019-11-27 | Disposition: A | Discharge status: Home / Self Care | Payer: PRIVATE HEALTH INSURANCE | Source: Ambulatory Visit | Attending: Certified Nurse Midwife | Admitting: Certified Nurse Midwife

## 2019-11-27 ENCOUNTER — Other Ambulatory Visit: Payer: Self-pay | Admitting: Certified Nurse Midwife

## 2019-11-27 ENCOUNTER — Other Ambulatory Visit: Payer: Self-pay

## 2019-11-27 VITALS — BP 120/80 | HR 68 | Temp 97.5°F | Resp 16 | Wt 127.0 lb

## 2019-11-27 DIAGNOSIS — R87810 Cervical high risk human papillomavirus (HPV) DNA test positive: Secondary | ICD-10-CM | POA: Diagnosis not present

## 2019-11-27 DIAGNOSIS — Z8742 Personal history of other diseases of the female genital tract: Secondary | ICD-10-CM | POA: Insufficient documentation

## 2019-11-27 DIAGNOSIS — Z780 Asymptomatic menopausal state: Secondary | ICD-10-CM

## 2019-11-27 DIAGNOSIS — Z1231 Encounter for screening mammogram for malignant neoplasm of breast: Secondary | ICD-10-CM

## 2019-11-27 NOTE — Progress Notes (Addendum)
Patient ID: Ashley Malone, female   DOB: 02/18/1961, 59 y.o.   MRN: [id number]  Chief Complaint  Patient presents with  . Procedure    colposcopy//jj    HPI Ashley Malone is a 59 y.o. G2P2002 white divorced female here for colposcopy. Menopausal, no HRT. Denies pelvic pain or vaginal bleeding.  Indications: Pap smear on November 11 2019 showed: Positive HPV, Positive 18,45. Previous colposcopy: 03/11/2015 for ASCUS HPV positive. Colposcopy results: Benign squamous mucosa with metaplasia, but no dysplasia.  Past Medical History:  Diagnosis Date  . Abnormal Pap smear of cervix    6/15 ASCUS HPV HR +, 11-11-2019 neg HPV HR+ 18/45+  . Fibroid   . Heart murmur   . Mitral valve prolapse 5 YRS AGO   NOT TREATED    Past Surgical History:  Procedure Laterality Date  . BREAST LIFT    . CERVICAL BIOPSY  W/ LOOP ELECTRODE EXCISION    . DILATION AND CURETTAGE OF UTERUS     secondary to atypical glandular cells  . ENDOMETRIAL BIOPSY     atypical glandular cells  . endometrial polyp    . TUBAL LIGATION    . UTERINE BIOPSIES  03-14-11    Family History  Problem Relation Age of Onset  . Heart murmur Mother   . Atrial fibrillation Mother   . Hypertension Mother   . Colon cancer Mother   . Prostate cancer Father     Social History Social History   Tobacco Use  . Smoking status: Former Research scientist (life sciences)  . Smokeless tobacco: Never Used  Substance Use Topics  . Alcohol use: Yes    Alcohol/week: 2.0 - 3.0 standard drinks    Types: 2 - 3 Glasses of wine per week  . Drug use: No    No Known Allergies  Current Outpatient Medications  Medication Sig Dispense Refill  . valACYclovir (VALTREX) 500 MG tablet Take bid x 3 day with onset of  outbreak 30 tablet 12   No current facility-administered medications for this visit.    Review of Systems Review of Systems  Constitutional: Negative.   HENT: Negative.   Eyes: Negative.   Respiratory: Negative.   Cardiovascular: Negative.     Gastrointestinal: Negative.   Endocrine: Negative.   Genitourinary: Negative.  Negative for pelvic pain, vaginal bleeding, vaginal discharge and vaginal pain.  Musculoskeletal: Negative.   Allergic/Immunologic: Negative.   Neurological: Negative.   Hematological: Negative.   Psychiatric/Behavioral:       Slightly anxious    Blood pressure 120/80, pulse 68, temperature (!) 97.5 F (36.4 C), temperature source Skin, resp. rate 16, weight 127 lb (57.6 kg), last menstrual period 09/12/2010.  Physical Exam Physical Exam Exam conducted with a chaperone present.  Constitutional:      Appearance: Normal appearance.  Cardiovascular:     Rate and Rhythm: Normal rate.     Pulses: Normal pulses.  Pulmonary:     Effort: Pulmonary effort is normal.  Genitourinary:    General: Normal vulva.     Exam position: Lithotomy position.     Skin:    General: Skin is warm and dry.  Neurological:     Mental Status: She is alert and oriented to person, place, and time.  Psychiatric:        Mood and Affect: Mood normal.        Behavior: Behavior normal.        Thought Content: Thought content normal.        Judgment:  Judgment normal.     Data Reviewed Reviewed pap smear with patient  Assessment    Procedure Details  The risks and benefits of the procedure and Written informed consent obtained.  Speculum placed in vagina and excellent visualization of cervix achieved, cervix swabbed x 3 with acetic acid solution with acetowhite effect noted at 5 o'clock, 9 o'clock and at 11 o'clock. Lugol's applied with very light staining noted. Cervix viewed with 3.75, 7.5, 15 # and green filter. Biopsies taken at 5,9,11 o'clock. ECC obtained. Monsel's applied. No active bleeding noted upon removal of speculum. Patient tolerated procedure well. Verbal and printed instructions given to patient. Verbalized understanding. Patient escorted to check out in stable condition.   Specimens: 3  Complications:  none.     Plan    Specimens labelled and sent to Pathology. Patient will be called with Pathology report when reviewed. 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 Discussed with Dr. Talbert Nan and patient needs LEEP procedure. Patient will be notified of results.  Rv prn.      Melvia Heaps 11/27/2019, 2:30 PM

## 2019-11-27 NOTE — Progress Notes (Signed)
Pap 11-11-2019 neg HPV HR+ 18/45 +. Previous history of LEEP.

## 2019-11-27 NOTE — Patient Instructions (Signed)

## 2019-11-27 NOTE — Telephone Encounter (Signed)
Patient did not return call. Patient seen in office today, 11/27/19.   Routing to [employer], CNM.   Encounter closed.

## 2019-12-02 LAB — SURGICAL PATHOLOGY

## 2019-12-03 ENCOUNTER — Telehealth: Payer: Self-pay | Admitting: *Deleted

## 2019-12-03 DIAGNOSIS — R87613 High grade squamous intraepithelial lesion on cytologic smear of cervix (HGSIL): Secondary | ICD-10-CM

## 2019-12-03 DIAGNOSIS — Z8742 Personal history of other diseases of the female genital tract: Secondary | ICD-10-CM

## 2019-12-03 DIAGNOSIS — R87810 Cervical high risk human papillomavirus (HPV) DNA test positive: Secondary | ICD-10-CM

## 2019-12-03 NOTE — Telephone Encounter (Signed)
Spoke with patient, advised of results as seen below per Melvia Heaps, CNM.   LEEP procedure scheduled for 4/8 at 1:30pm with Dr. Talbert Nan.  Order placed for precert.  Brief explanation of LEEP provided, questions answered.  Patient verbalizes understanding and is agreeable.   Routing to provider for final review. Patient is agreeable to disposition. Will close encounter.  Cc: Dr. Talbert Nan, [facility] [place], [place]

## 2019-12-03 NOTE — Telephone Encounter (Signed)
-----   Message from Regina Eck, CNM sent at 12/03/2019 12:45 PM EDT ----- Notify patient her Pathology showed LSIL on the cervical biopsy at 5 and 9 o'clock. The ECC showed dysplasia with HGSIL. She needs LEEP scheduled. Discussed with Dr. Talbert Nan and agrees. Please schedule with her.

## 2019-12-03 NOTE — Telephone Encounter (Signed)
Burnice Logan, RN  12/03/2019 2:46 PM EDT    Left message to call Sharee Pimple, RN at [facility].

## 2019-12-03 NOTE — Telephone Encounter (Signed)
Left message to call Rilie Glanz, RN at [facility] [phone].   

## 2019-12-03 NOTE — Telephone Encounter (Signed)
Patient is returning a call to Jill. °

## 2019-12-04 ENCOUNTER — Encounter: Payer: Self-pay | Admitting: Certified Nurse Midwife

## 2019-12-04 ENCOUNTER — Telehealth: Payer: Self-pay | Admitting: Obstetrics and Gynecology

## 2019-12-04 NOTE — Telephone Encounter (Signed)
Call placed to convey benefits for LEEP. Spoke with the patient and conveyed the benefits. Patient understands/agreeable with the benefits. Patient is aware of the cancellation policy. Appointment scheduled 12/19/19. Authorization [id number]

## 2019-12-11 ENCOUNTER — Telehealth: Payer: Self-pay | Admitting: Obstetrics and Gynecology

## 2019-12-11 NOTE — Telephone Encounter (Signed)
Patient has a question about Leep procedure.

## 2019-12-11 NOTE — Telephone Encounter (Signed)
Spoke to pt. Pt calling to reschedule LEEP procedure due to son's wedding a couple days after and vacation. Pt now scheduled for LEEP on 01/08/2020 at 3pm. Pt aware and verbalized understanding.   Routing to Dr Talbert Nan for update and review. Encounter closed.

## 2019-12-19 ENCOUNTER — Ambulatory Visit: Payer: Self-pay | Admitting: Obstetrics and Gynecology

## 2020-01-07 ENCOUNTER — Other Ambulatory Visit: Payer: Self-pay

## 2020-01-08 ENCOUNTER — Encounter: Payer: Self-pay | Admitting: Obstetrics and Gynecology

## 2020-01-08 ENCOUNTER — Other Ambulatory Visit (HOSPITAL_COMMUNITY)
Admission: RE | Admit: 2020-01-08 | Discharge: 2020-01-08 | Disposition: A | Discharge status: Home / Self Care | Payer: PRIVATE HEALTH INSURANCE | Source: Ambulatory Visit | Attending: Obstetrics and Gynecology | Admitting: Obstetrics and Gynecology

## 2020-01-08 ENCOUNTER — Ambulatory Visit (INDEPENDENT_AMBULATORY_CARE_PROVIDER_SITE_OTHER): Payer: PRIVATE HEALTH INSURANCE | Admitting: Obstetrics and Gynecology

## 2020-01-08 VITALS — BP 144/80 | Temp 98.1°F | Ht 64.0 in | Wt 126.0 lb

## 2020-01-08 DIAGNOSIS — R87810 Cervical high risk human papillomavirus (HPV) DNA test positive: Secondary | ICD-10-CM | POA: Insufficient documentation

## 2020-01-08 DIAGNOSIS — Z8742 Personal history of other diseases of the female genital tract: Secondary | ICD-10-CM | POA: Diagnosis present

## 2020-01-08 NOTE — Patient Instructions (Signed)

## 2020-01-08 NOTE — Progress Notes (Signed)
GYNECOLOGY  VISIT   HPI: 59 y.o.   Divorced White or Caucasian Not Hispanic or Latino  female   [phone] with Patient's last menstrual period was 09/12/2010.   here for a LEEP. Recent colpo biopsies with CIN 1, ECC minute fragments with dysplasia worrisome for HSIL  Pap from 11/11/19 negative with +HPV 18/45. Patient took 500mg  of ibuprofen.   GYNECOLOGIC HISTORY: Patient's last menstrual period was 09/12/2010. Contraception:PMP Menopausal hormone therapy: none         OB History    Gravida  2   Para  2   Term  2   Preterm      AB      Living  2     SAB      TAB      Ectopic      Multiple      Live Births  2              Patient Active Problem List   Diagnosis Date Noted  . Fullness in ear, right 03/30/2018  . 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 Abnormal Pap smear of cervix    6/15 ASCUS HPV HR +, 11-11-2019 neg HPV HR+ 18/45+  . Fibroid   . Heart murmur   . Mitral valve prolapse 5 YRS AGO   NOT TREATED    Past Surgical History:  Procedure Laterality Date  . BREAST LIFT    . CERVICAL BIOPSY  W/ LOOP ELECTRODE EXCISION    . DILATION AND CURETTAGE OF UTERUS     secondary to atypical glandular cells  . ENDOMETRIAL BIOPSY     atypical glandular cells  . endometrial polyp    . TUBAL LIGATION    . UTERINE BIOPSIES  03-14-11    Current Outpatient Medications  Medication Sig Dispense Refill  . valACYclovir (VALTREX) 500 MG tablet Take bid x 3 day with onset of  outbreak 30 tablet 12   No current facility-administered medications for this visit.     ALLERGIES: Patient has no known allergies.  Family History  Problem Relation Age of Onset  . Heart murmur Mother   . Atrial fibrillation Mother   . Hypertension Mother   . Colon cancer Mother   . Prostate cancer Father     Social History   Socioeconomic History  . Marital status: Divorced    Spouse name: Not on file  . Number of children: Not on file  .  Years of education: Not on file  . Highest education level: Not on file  Occupational History  . Not on file  Tobacco Use  . Smoking status: Former Research scientist (life sciences)  . Smokeless tobacco: Never Used  Substance and Sexual Activity  . Alcohol use: Yes    Alcohol/week: 2.0 - 3.0 standard drinks    Types: 2 - 3 Glasses of wine per week  . Drug use: No  . Sexual activity: Not Currently    Partners: Male    Birth control/protection: Surgical    Comment: BTL  Other Topics Concern  . Not on file  Social History Narrative  . Not on file   Social Determinants of Health   Financial Resource Strain:   . Difficulty of Paying Living Expenses:   Food Insecurity:   . Worried About Charity fundraiser in the Last Year:   . Arboriculturist in the Last Year:   Transportation Needs:   . Lack of Transportation (  Medical):   Marland Kitchen Lack of Transportation (Non-Medical):   Physical Activity:   . Days of Exercise per Week:   . Minutes of Exercise per Session:   Stress:   . Feeling of Stress :   Social Connections:   . Frequency of Communication with Friends and Family:   . Frequency of Social Gatherings with Friends and Family:   . Attends Religious Services:   . Active Member of Clubs or Organizations:   . Attends Archivist Meetings:   Marland Kitchen Marital Status:   Intimate Partner Violence:   . Fear of Current or Ex-Partner:   . Emotionally Abused:   Marland Kitchen Physically Abused:   . Sexually Abused:     Review of Systems  All other systems reviewed and are negative.   PHYSICAL EXAMINATION:    LMP 09/12/2010     General appearance: alert, cooperative and appears stated age  Pelvic: External genitalia:  no lesions              Urethra:  normal appearing urethra with no masses, tenderness or lesions              Bartholins and Skenes: normal                 Vagina: normal appearing vagina with normal color and discharge, no lesions              Cervix: no gross lesions  Procedure: The patient was  counseled as to the risks of the procedure, including: infection, bleeding, future pregnancy risks and cervical stenosis. A consent form was signed.  Under colposcopic guidance, Lugols solution was placed on the cervix and a paracervical block was injected using 1% lidocaine with epinephrine, ~10 cc was used. Under colposcopic guidance, the 0.2 x 0.8 cm loop was used to remove a portion of the ectocervix taking care to get the entire transformation zone.  A second 1 x 1 cm loop was used to remove a portion of the endocervix. The settings were 55 cut, 77 coag with a blend of 1.  An ECC was performed. The cautery ball was then used to cauterize the base of the biopsy site and monsels were placed. I cauterized on the exocervix slightly beyond the biopsy site.  The patient tolerated the procedure well.    Chaperone was present for exam.  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

## 2020-01-10 LAB — SURGICAL PATHOLOGY

## 2020-02-04 ENCOUNTER — Ambulatory Visit: Payer: PRIVATE HEALTH INSURANCE

## 2020-02-04 ENCOUNTER — Other Ambulatory Visit: Payer: PRIVATE HEALTH INSURANCE

## 2020-02-06 ENCOUNTER — Other Ambulatory Visit: Payer: Self-pay

## 2020-02-07 ENCOUNTER — Encounter: Payer: Self-pay | Admitting: Obstetrics and Gynecology

## 2020-02-07 ENCOUNTER — Ambulatory Visit (INDEPENDENT_AMBULATORY_CARE_PROVIDER_SITE_OTHER): Payer: PRIVATE HEALTH INSURANCE | Admitting: Obstetrics and Gynecology

## 2020-02-07 VITALS — BP 130/68 | HR 72 | Temp 97.9°F | Ht 64.0 in | Wt 125.0 lb

## 2020-02-07 DIAGNOSIS — Z9889 Other specified postprocedural states: Secondary | ICD-10-CM

## 2020-02-07 NOTE — Progress Notes (Signed)
GYNECOLOGY  VISIT   HPI: 59 y.o.   Divorced White or Caucasian Not Hispanic or Latino  female   [phone] with Patient's last menstrual period was 09/12/2010.   here for 4 week follow up after LEEP procedure. Patient states that she had bleeding for 3 weeks follow procedure. She says she is still having a pink discharge. Her leep specimen and ECC were negative for dysplasia. Prior to the leep, cervical biopsy returned with CIN I and the ECC returned with possible HSIL.    GYNECOLOGIC HISTORY: Patient's last menstrual period was 09/12/2010. Contraception: N/A Menopausal hormone therapy: none         OB History    Gravida  2   Para  2   Term  2   Preterm      AB      Living  2     SAB      TAB      Ectopic      Multiple      Live Births  2              Patient Active Problem List   Diagnosis Date Noted  . Fullness in ear, right 03/30/2018  . 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 Abnormal Pap smear of cervix    6/15 ASCUS HPV HR +, 11-11-2019 neg HPV HR+ 18/45+  . Fibroid   . Heart murmur   . Mitral valve prolapse 5 YRS AGO   NOT TREATED    Past Surgical History:  Procedure Laterality Date  . BREAST LIFT    . CERVICAL BIOPSY  W/ LOOP ELECTRODE EXCISION    . DILATION AND CURETTAGE OF UTERUS     secondary to atypical glandular cells  . ENDOMETRIAL BIOPSY     atypical glandular cells  . endometrial polyp    . TUBAL LIGATION    . UTERINE BIOPSIES  03-14-11    Current Outpatient Medications  Medication Sig Dispense Refill  . valACYclovir (VALTREX) 500 MG tablet Take bid x 3 day with onset of  outbreak 30 tablet 12   No current facility-administered medications for this visit.     ALLERGIES: Patient has no known allergies.  Family History  Problem Relation Age of Onset  . Heart murmur Mother   . Atrial fibrillation Mother   . Hypertension Mother   . Colon cancer Mother   . Prostate cancer Father     Social  History   Socioeconomic History  . Marital status: Divorced    Spouse name: Not on file  . Number of children: Not on file  . Years of education: Not on file  . Highest education level: Not on file  Occupational History  . Not on file  Tobacco Use  . Smoking status: Former Research scientist (life sciences)  . Smokeless tobacco: Never Used  Substance and Sexual Activity  . Alcohol use: Yes    Alcohol/week: 2.0 - 3.0 standard drinks    Types: 2 - 3 Glasses of wine per week  . Drug use: No  . Sexual activity: Not Currently    Partners: Male    Birth control/protection: Surgical    Comment: BTL  Other Topics Concern  . Not on file  Social History Narrative  . Not on file   Social Determinants of Health   Financial Resource Strain:   . Difficulty of Paying Living Expenses:   Food Insecurity:   . Worried About [employer] of  Food in the Last Year:   . [place] in the Last Year:   Transportation Needs:   . Film/video editor (Medical):   Marland Kitchen Lack of Transportation (Non-Medical):   Physical Activity:   . Days of Exercise per Week:   . Minutes of Exercise per Session:   Stress:   . Feeling of Stress :   Social Connections:   . Frequency of Communication with Friends and Family:   . Frequency of Social Gatherings with Friends and Family:   . Attends Religious Services:   . Active Member of Clubs or Organizations:   . Attends Archivist Meetings:   Marland Kitchen Marital Status:   Intimate Partner Violence:   . Fear of Current or Ex-Partner:   . Emotionally Abused:   Marland Kitchen Physically Abused:   . Sexually Abused:     Review of Systems  All other systems reviewed and are negative.   PHYSICAL EXAMINATION:    BP 130/68   Pulse 72   Temp 97.9 F (36.6 C)   Ht 5\' 4"  (1.626 m)   Wt 125 lb (56.7 kg)   LMP 09/12/2010   SpO2 99%   BMI 21.46 kg/m     General appearance: alert, cooperative and appears stated age  Pelvic: External genitalia:  no lesions              Urethra:  normal  appearing urethra with no masses, tenderness or lesions              Bartholins and Skenes: normal                 Vagina: atrophic appearing vagina with normal color and discharge, no lesions              Cervix: no lesions, healing slowly, friable, treated with silver nitrate.  Chaperone was present for exam.  ASSESSMENT H/O LEEP, healing slowly, no signs of infection.     PLAN Friability treated with silver nitrate F/U in a month if she continues to spot, or sooner if concerned Needs HPV testing in 5 months

## 2020-04-23 ENCOUNTER — Telehealth: Payer: Self-pay | Admitting: General Practice

## 2020-04-23 NOTE — Telephone Encounter (Signed)
Patient called to see if she saw [place] in the past.

## 2020-07-15 ENCOUNTER — Other Ambulatory Visit (HOSPITAL_COMMUNITY)
Admission: RE | Admit: 2020-07-15 | Discharge: 2020-07-15 | Disposition: A | Discharge status: Home / Self Care | Payer: PRIVATE HEALTH INSURANCE | Source: Ambulatory Visit | Attending: Obstetrics and Gynecology | Admitting: Obstetrics and Gynecology

## 2020-07-15 ENCOUNTER — Ambulatory Visit (INDEPENDENT_AMBULATORY_CARE_PROVIDER_SITE_OTHER): Payer: PRIVATE HEALTH INSURANCE | Admitting: Obstetrics and Gynecology

## 2020-07-15 ENCOUNTER — Other Ambulatory Visit: Payer: Self-pay

## 2020-07-15 ENCOUNTER — Encounter: Payer: Self-pay | Admitting: Obstetrics and Gynecology

## 2020-07-15 VITALS — BP 126/72 | HR 66 | Ht 64.0 in | Wt 129.2 lb

## 2020-07-15 DIAGNOSIS — Z9889 Other specified postprocedural states: Secondary | ICD-10-CM | POA: Diagnosis not present

## 2020-07-15 DIAGNOSIS — Z8619 Personal history of other infectious and parasitic diseases: Secondary | ICD-10-CM

## 2020-07-15 NOTE — Progress Notes (Signed)
GYNECOLOGY  VISIT   HPI: 59 y.o.   Divorced White or Caucasian Not Hispanic or Latino  female   [phone] with Patient's last menstrual period was 09/12/2010.   here for 6 month repeat pap.   In 4/21 she had a leep for +HPV on pap and ECC worrisome for HSIL. Loop cone specimen and ECC were negative for dysplasia.   GYNECOLOGIC HISTORY: Patient's last menstrual period was 09/12/2010. Contraception:pmp Menopausal hormone therapy: none         OB History    Gravida  2   Para  2   Term  2   Preterm      AB      Living  2     SAB      TAB      Ectopic      Multiple      Live Births  2              Patient Active Problem List   Diagnosis Date Noted  . Fullness in ear, right 03/30/2018  . 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 Abnormal Pap smear of cervix    6/15 ASCUS HPV HR +, 11-11-2019 neg HPV HR+ 18/45+  . Fibroid   . Heart murmur   . Mitral valve prolapse 5 YRS AGO   NOT TREATED    Past Surgical History:  Procedure Laterality Date  . BREAST LIFT    . CERVICAL BIOPSY  W/ LOOP ELECTRODE EXCISION    . DILATION AND CURETTAGE OF UTERUS     secondary to atypical glandular cells  . ENDOMETRIAL BIOPSY     atypical glandular cells  . endometrial polyp    . TUBAL LIGATION    . UTERINE BIOPSIES  03-14-11    Current Outpatient Medications  Medication Sig Dispense Refill  . valACYclovir (VALTREX) 500 MG tablet Take bid x 3 day with onset of  outbreak (Patient not taking: Reported on 07/15/2020) 30 tablet 12   No current facility-administered medications for this visit.     ALLERGIES: Patient has no known allergies.  Family History  Problem Relation Age of Onset  . Heart murmur Mother   . Atrial fibrillation Mother   . Hypertension Mother   . Colon cancer Mother   . Prostate cancer Father     Social History   Socioeconomic History  . Marital status: Divorced    Spouse name: Not on file  . Number of children:  Not on file  . Years of education: Not on file  . Highest education level: Not on file  Occupational History  . Not on file  Tobacco Use  . Smoking status: Former Research scientist (life sciences)  . Smokeless tobacco: Never Used  Substance and Sexual Activity  . Alcohol use: Yes    Alcohol/week: 2.0 - 3.0 standard drinks    Types: 2 - 3 Glasses of wine per week  . Drug use: No  . Sexual activity: Not Currently    Partners: Male    Birth control/protection: Surgical    Comment: BTL  Other Topics Concern  . Not on file  Social History Narrative  . Not on file   Social Determinants of Health   Financial Resource Strain:   . Difficulty of Paying Living Expenses: Not on file  Food Insecurity:   . Worried About Charity fundraiser in the Last Year: Not on file  . Ran Out of Food in the Last  Year: Not on file  Transportation Needs:   . Lack of Transportation (Medical): Not on file  . Lack of Transportation (Non-Medical): Not on file  Physical Activity:   . Days of Exercise per Week: Not on file  . Minutes of Exercise per Session: Not on file  Stress:   . Feeling of Stress : Not on file  Social Connections:   . Frequency of Communication with Friends and Family: Not on file  . Frequency of Social Gatherings with Friends and Family: Not on file  . Attends Religious Services: Not on file  . Active Member of Clubs or Organizations: Not on file  . Attends Archivist Meetings: Not on file  . Marital Status: Not on file  Intimate Partner Violence:   . Fear of Current or Ex-Partner: Not on file  . Emotionally Abused: Not on file  . Physically Abused: Not on file  . Sexually Abused: Not on file    Review of Systems  All other systems reviewed and are negative.   PHYSICAL EXAMINATION:    BP 126/72   Pulse 66   Ht 5\' 4"  (1.626 m)   Wt 129 lb 3.2 oz (58.6 kg)   LMP 09/12/2010   SpO2 100%   BMI 22.18 kg/m     General appearance: alert, cooperative and appears stated age   Pelvic:  External genitalia:  no lesions              Urethra:  normal appearing urethra with no masses, tenderness or lesions              Bartholins and Skenes: normal                 Vagina: atrophic appearing vagina with normal color and discharge, no lesions              Cervix: flush with vagina and stenotic  Chaperone was present for exam.  ASSESSMENT H/O HPV, ECC at time of colpo worrisome for HSIL. Loop cone in 4/21 was negative for dysplasia with negative ECC    PLAN Pap with hpv If she needs another colposcopy would pretreat with vaginal estrogen and misoprostol.

## 2020-07-17 LAB — CYTOLOGY - PAP
Comment: NEGATIVE
Diagnosis: NEGATIVE
High risk HPV: NEGATIVE

## 2020-11-11 ENCOUNTER — Ambulatory Visit: Payer: PRIVATE HEALTH INSURANCE | Admitting: Certified Nurse Midwife

## 2021-04-26 NOTE — Progress Notes (Signed)
60 y.o. G60P2002 Divorced White or Caucasian Not Hispanic or Latino female here for annual exam.  No vaginal bleeding. Not sexually active.     Patient's last menstrual period was 09/12/2010.          Sexually active: No.  The current method of family planning is post menopausal status.    Exercising: Yes.     Walking  Smoker:  no  Health Maintenance: Pap: 07/15/20 WNL Hr HPV Neg; pap 11/11/19 negative with + HPV 18/45  History of abnormal Pap:  yes history of LEEP in 5/21, negative dysplasia. 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  MMG:  06/20/18 Density C Bi-rads 1 neg  BMD:   none  Colonoscopy: 06/26/15 f/u 10 years  TDaP: 12//3/15  Gardasil: n/a   reports that she has quit smoking. She has never used smokeless tobacco. She reports current alcohol use of about 2.0 - 3.0 standard drinks per week. She reports that she does not use drugs. Kids are 31 and 53, local. She has 4 grandchildren. 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   Past Medical History:  Diagnosis Date   Abnormal Pap smear of cervix    6/15 ASCUS HPV HR +, 11-11-2019 neg HPV HR+ 18/45+   Fibroid    Heart murmur    Mitral valve prolapse 5 YRS AGO   NOT TREATED    Past Surgical History:  Procedure Laterality Date   BREAST LIFT     CERVICAL BIOPSY  W/ LOOP ELECTRODE EXCISION     DILATION AND CURETTAGE OF UTERUS     secondary to atypical glandular cells   ENDOMETRIAL BIOPSY     atypical glandular cells   endometrial polyp     TUBAL LIGATION     UTERINE BIOPSIES  03-14-11    Current Outpatient Medications  Medication Sig Dispense Refill   valACYclovir (VALTREX) 500 MG tablet Take bid x 3 day with onset of  outbreak (Patient taking differently: Take bid x 3 day with onset of  outbreak) 30 tablet 12   No current facility-administered medications for this visit.    Family History  Problem Relation Age of Onset   Heart murmur Mother    Atrial  fibrillation Mother    Hypertension Mother    Colon cancer Mother    Prostate cancer Father     Review of Systems  All other systems reviewed and are negative.  Exam:   BP 130/72   Pulse 66   Ht '5\' 4"'$  (1.626 m)   Wt 124 lb (56.2 kg)   LMP 09/12/2010   SpO2 99%   BMI 21.28 kg/m   Weight change: '@WEIGHTCHANGE'$ @ Height:   Height: '5\' 4"'$  (162.6 cm)  Ht Readings from Last 3 Encounters:  04/27/21 '5\' 4"'$  (1.626 m)  07/15/20 '5\' 4"'$  (1.626 m)  02/07/20 '5\' 4"'$  (1.626 m)    General appearance: alert, cooperative and appears stated age Head: Normocephalic, without obvious abnormality, atraumatic Neck: no adenopathy, supple, symmetrical, trachea midline and thyroid normal to inspection and palpation Lungs: clear to auscultation bilaterally Cardiovascular: regular rate and rhythm Breasts: normal appearance, no masses or tenderness Abdomen: soft, non-tender; non distended,  no masses,  no organomegaly Extremities: extremities normal, atraumatic, no cyanosis or edema Skin: Skin color, texture, turgor normal. No rashes or lesions Lymph nodes: Cervical, supraclavicular, and axillary nodes normal. No abnormal inguinal nodes palpated Neurologic: Grossly normal   Pelvic: External genitalia:  no lesions              Urethra:  normal appearing urethra with no masses, tenderness or lesions              Bartholins and Skenes: normal                 Vagina: atrophic appearing vagina with normal color and discharge, no lesions              Cervix: flush with vagina and stenotic.                Bimanual Exam:  Uterus:  normal size, contour, position, consistency, mobility, non-tender              Adnexa: no mass, fullness, tenderness               Rectovaginal: Confirms               Anus:  normal sphincter tone, no lesions  Gae Dry chaperoned for the exam.  1. Well woman exam Discussed breast self exam Discussed calcium and vit D intake Declines labs this year Mammogram due, she will  schedule Colonoscopy UTD  2. 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 Screening for cervical cancer - Cytology - PAP

## 2021-04-27 ENCOUNTER — Ambulatory Visit (INDEPENDENT_AMBULATORY_CARE_PROVIDER_SITE_OTHER): Payer: PRIVATE HEALTH INSURANCE | Admitting: Obstetrics and Gynecology

## 2021-04-27 ENCOUNTER — Other Ambulatory Visit (HOSPITAL_COMMUNITY)
Admission: RE | Admit: 2021-04-27 | Discharge: 2021-04-27 | Disposition: A | Discharge status: Home / Self Care | Payer: PRIVATE HEALTH INSURANCE | Source: Ambulatory Visit | Attending: Obstetrics and Gynecology | Admitting: Obstetrics and Gynecology

## 2021-04-27 ENCOUNTER — Other Ambulatory Visit: Payer: Self-pay

## 2021-04-27 ENCOUNTER — Encounter: Payer: Self-pay | Admitting: Obstetrics and Gynecology

## 2021-04-27 VITALS — BP 130/72 | HR 66 | Ht 64.0 in | Wt 124.0 lb

## 2021-04-27 DIAGNOSIS — Z01419 Encounter for gynecological examination (general) (routine) without abnormal findings: Secondary | ICD-10-CM | POA: Diagnosis not present

## 2021-04-27 DIAGNOSIS — Z124 Encounter for screening for malignant neoplasm of cervix: Secondary | ICD-10-CM

## 2021-04-27 DIAGNOSIS — A6000 Herpesviral infection of urogenital system, unspecified: Secondary | ICD-10-CM | POA: Diagnosis not present

## 2021-04-27 MED ORDER — VALACYCLOVIR HCL 500 MG PO TABS: ORAL_TABLET | ORAL | 12 refills | Status: AC

## 2021-04-27 NOTE — Patient Instructions (Signed)

## 2021-04-30 LAB — CYTOLOGY - PAP
Adequacy: ABSENT
Comment: NEGATIVE
Diagnosis: UNDETERMINED — AB
High risk HPV: POSITIVE — AB

## 2021-05-04 ENCOUNTER — Telehealth: Payer: Self-pay

## 2021-05-04 DIAGNOSIS — R87610 Atypical squamous cells of undetermined significance on cytologic smear of cervix (ASC-US): Secondary | ICD-10-CM

## 2021-05-04 NOTE — Telephone Encounter (Signed)
Result note: "Please let the patient know that her pap returned with ASCUS, +HPV. She will need another colposcopy.  She has vaginal atrophy and a stenotic cervix. I would recommend that she use estrace cream 1 gram vaginally qhs for 2 weeks prior to the colposcopy and cytotec 200 mcg, place 2 tablets vaginally 6-12 hours prior to the colposcopy.  Please call in both scripts (only 2 tablets of cytotec and estrace 42.5 grams)."   I spoke with patient about this recommendation. She was very upset about having to have colpo again. She said previously it was very painful. She wanted to decline and I did encourage her.  She said she went through this same thing previously and Pap came back normal and neg HPV.  She asked if anyway possible you would consider repeating a Pap smear in 3 mos? 6 mos?  And she said if result returns abnormal she would feel more agreeable about having colpo.

## 2021-05-07 MED ORDER — ESTRADIOL 0.1 MG/GM VA CREA: TOPICAL_CREAM | VAGINAL | 0 refills | Status: DC

## 2021-05-07 MED ORDER — MISOPROSTOL 200 MCG PO TABS: 200.0000 ug | ORAL_TABLET | Freq: Four times a day (QID) | ORAL | 0 refills | Status: DC

## 2021-05-07 MED ORDER — MISOPROSTOL 200 MCG PO TABS: ORAL_TABLET | ORAL | 0 refills | Status: DC

## 2021-05-07 NOTE — Telephone Encounter (Signed)
Please let the patient know that I understand her frustration. She obviously gets to decide how she wants to proceed. There are guidelines for managing abnormal paps and the guidelines are to proceed with colposcopy. I'm concerned that she could have dysplasia in her vagina, or her cervix. If dysplasia is left untreated it could progress to cancer.  If she agree's to colposcopy I would definitely recommend pretreating with the cytotec and estrogen (last note).  Repeating her pap in 3-6 months is preferable to doing nothing.  If she would like another opinion, we can set up a referral for her.

## 2021-05-07 NOTE — Telephone Encounter (Signed)
Patient informed with below note, patient said she will schedule colpo, message sent to appointments to schedule. She is aware of the medication, Rx sent.

## 2021-05-13 NOTE — Telephone Encounter (Signed)
Colpo scheduled 06/17/21.

## 2021-05-18 ENCOUNTER — Telehealth: Payer: Self-pay | Admitting: *Deleted

## 2021-05-18 MED ORDER — ALPRAZOLAM 1 MG PO TABS: ORAL_TABLET | ORAL | 0 refills | Status: AC

## 2021-05-18 NOTE — Telephone Encounter (Signed)
I sent in one xanax for her, she should take it one hour prior to her procedure. Please get a verbal consent from her (go over the colpo consent form with her). She will need a driver.

## 2021-05-18 NOTE — Telephone Encounter (Signed)
Patient has colposcopy scheduled 06/17/21 reports you told her you would send in Xanax to help during this procedure? Please advise

## 2021-05-19 NOTE — Telephone Encounter (Addendum)
Patient informed with below note, verbal consent done via phone with Kristine Linea, RMA was witness.   Dr.Jertson patient did mention she has heart murmur and history of heart palpitations when she received the COVID vaccine.  She saw a cardiologist years ago and was cleared and was told to follow up if any further problems. Patient said she has not had any palpitations since covid vaccine.   Consent form given to Baxter Flattery, CMA .

## 2021-06-14 ENCOUNTER — Other Ambulatory Visit: Payer: Self-pay | Admitting: Obstetrics and Gynecology

## 2021-06-14 DIAGNOSIS — Z1231 Encounter for screening mammogram for malignant neoplasm of breast: Secondary | ICD-10-CM

## 2021-06-17 ENCOUNTER — Ambulatory Visit: Payer: PRIVATE HEALTH INSURANCE | Admitting: Obstetrics and Gynecology

## 2021-06-21 ENCOUNTER — Ambulatory Visit (INDEPENDENT_AMBULATORY_CARE_PROVIDER_SITE_OTHER): Payer: PRIVATE HEALTH INSURANCE | Admitting: Obstetrics and Gynecology

## 2021-06-21 ENCOUNTER — Other Ambulatory Visit: Payer: Self-pay

## 2021-06-21 ENCOUNTER — Encounter: Payer: Self-pay | Admitting: Obstetrics and Gynecology

## 2021-06-21 VITALS — BP 160/96 | HR 71 | Temp 98.1°F | Ht 64.0 in | Wt 124.0 lb

## 2021-06-21 DIAGNOSIS — M5489 Other dorsalgia: Secondary | ICD-10-CM | POA: Diagnosis not present

## 2021-06-21 DIAGNOSIS — R350 Frequency of micturition: Secondary | ICD-10-CM

## 2021-06-21 DIAGNOSIS — R1030 Lower abdominal pain, unspecified: Secondary | ICD-10-CM

## 2021-06-21 LAB — CBC WITH DIFFERENTIAL/PLATELET
Absolute Monocytes: 400 cells/uL (ref 200–950)
Basophils Absolute: 11 cells/uL (ref 0–200)
Basophils Relative: 0.3 %
Eosinophils Absolute: 59 cells/uL (ref 15–500)
Eosinophils Relative: 1.6 %
HCT: 41.8 % (ref 35.0–45.0)
Hemoglobin: 14 g/dL (ref 11.7–15.5)
Lymphs Abs: 1306 cells/uL (ref 850–3900)
MCH: 32.4 pg (ref 27.0–33.0)
MCHC: 33.5 g/dL (ref 32.0–36.0)
MCV: 96.8 fL (ref 80.0–100.0)
MPV: 9.3 fL (ref 7.5–12.5)
Monocytes Relative: 10.8 %
Neutro Abs: 1924 cells/uL (ref 1500–7800)
Neutrophils Relative %: 52 %
Platelets: 281 10*3/uL (ref 140–400)
RBC: 4.32 10*6/uL (ref 3.80–5.10)
RDW: 12.2 % (ref 11.0–15.0)
Total Lymphocyte: 35.3 %
WBC: 3.7 10*3/uL — ABNORMAL LOW (ref 3.8–10.8)

## 2021-06-21 LAB — URINALYSIS, COMPLETE W/RFL CULTURE
Bacteria, UA: NONE SEEN /HPF
Bilirubin Urine: NEGATIVE
Glucose, UA: NEGATIVE
Hgb urine dipstick: NEGATIVE
Hyaline Cast: NONE SEEN /LPF
Ketones, ur: NEGATIVE
Leukocyte Esterase: NEGATIVE
Nitrites, Initial: NEGATIVE
Protein, ur: NEGATIVE
RBC / HPF: NONE SEEN /HPF (ref 0–2)
Specific Gravity, Urine: 1.01 (ref 1.001–1.035)
WBC, UA: NONE SEEN /HPF (ref 0–5)
pH: 5.5 (ref 5.0–8.0)

## 2021-06-21 LAB — NO CULTURE INDICATED

## 2021-06-21 MED ORDER — PHENAZOPYRIDINE HCL 200 MG PO TABS: 200.0000 mg | ORAL_TABLET | Freq: Three times a day (TID) | ORAL | 0 refills | Status: DC

## 2021-06-21 NOTE — Progress Notes (Signed)
GYNECOLOGY  VISIT   HPI: 60 y.o.   Divorced  Caucasian  female   G2P2002 with Patient's last menstrual period was 09/12/2010.   here for lower pelvic pressure and urinary frequency. Symptoms began 3 weeks ago was treated with Cipro for UTI, symptoms did not improve and was seen at Urgent Care at [facility] this weekend and given Macrobid. Patient still without much improvement and now with low back ache.   3 weeks ago she did an at home test which was + for an infection. She had Cipro at home, took 500 mg BID x 4 days. It didn't help at all, felt worse. She went to Urgent Care on Friday (in [facility]). Urine dip was negative other than blood. On Friday she was started on Macrobid, 100 mg BID. She has had minimal improvement. Now she has mid back pain. No fevers.  Her symptoms are SP pressure and tenderness. She is hydrating a lot. Voiding frequently but large amounts. She has some urinary frequency. No dysuria, but feels warm in her vaginal area (no when she voids).  She feels achy in her lower abdomen and lower to mid back.   She hasn't had sex. She has been drinking a lot of coffee and ice tea. She has stopped drinking the iced tea 3 weeks ago.   GYNECOLOGIC HISTORY: Patient's last menstrual period was 09/12/2010. Contraception:  PMP Menopausal hormone therapy: Estrace vag.cream--hasn't started yet Last mammogram: 06/20/18 Density C Bi-rads 1 neg .  Appt. 07/12/21 Last pap smear:  04-27-21 ASCUS:Pos HR HPV; colpo appt. 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    Past Medical History:  Diagnosis Date   Abnormal Pap smear of cervix    6/15 ASCUS HPV HR +,  11-11-2019 neg HPV HR+ 18/45+   Fibroid    Heart murmur    Mitral valve prolapse 5 YRS AGO   NOT TREATED    Past Surgical History:  Procedure Laterality Date   BREAST LIFT     CERVICAL BIOPSY  W/ LOOP ELECTRODE EXCISION     DILATION AND CURETTAGE OF UTERUS     secondary to atypical glandular cells   ENDOMETRIAL BIOPSY     atypical glandular cells   endometrial polyp     TUBAL LIGATION     UTERINE BIOPSIES  03-14-11    Current Outpatient Medications  Medication Sig Dispense Refill   ALPRAZolam (XANAX) 1 MG tablet Take one tablet one hour prior to your procedure. 1 tablet 0   estradiol (ESTRACE VAGINAL) 0.1 MG/GM vaginal cream Insert 1 gram vaginally at bedtime 2 weeks prior to procedure 42.5 g 0   misoprostol (CYTOTEC) 200 MCG tablet Insert 2 tablets vaginally 6-12 hours prior to procedure 2 tablet 0   nitrofurantoin, macrocrystal-monohydrate, (MACROBID) 100 MG capsule Take by mouth.  valACYclovir (VALTREX) 500 MG tablet Take bid x 3 day with onset of  outbreak 30 tablet 12   No current facility-administered medications for this visit.     ALLERGIES: Patient has no known allergies.  Family History  Problem Relation Age of Onset   Heart murmur Mother    Atrial fibrillation Mother    Hypertension Mother    Colon cancer Mother    Prostate cancer Father     Social History   Socioeconomic History   Marital status: Divorced    Spouse name: Not on file   Number of children: Not on file   Years of education: Not on file   Highest education level: Not on file  Occupational History   Not on file  Tobacco Use   Smoking status: Former   Smokeless tobacco: Never  Substance and Sexual Activity   Alcohol use: Yes    Alcohol/week: 2.0 - 3.0 standard drinks    Types: 2 - 3 Glasses of wine per week   Drug use: No   Sexual activity: Not Currently    Partners: Male    Birth control/protection: Surgical    Comment: BTL  Other Topics Concern   Not on file  Social History  Narrative   Not on file   Social Determinants of Health   Financial Resource Strain: Not on file  Food Insecurity: Not on file  Transportation Needs: Not on file  Physical Activity: Not on file  Stress: Not on file  Social Connections: Not on file  Intimate Partner Violence: Not on file    Review of Systems  Genitourinary:  Positive for frequency and pelvic pain (low pelvic pressure x2-3 weeks).  Musculoskeletal:  Positive for back pain.  All other systems reviewed and are negative.  PHYSICAL EXAMINATION:    BP (!) 160/96   Pulse 71   Temp 98.1 F (36.7 C) (Oral)   Ht 5\' 4"  (1.626 m)   Wt 124 lb (56.2 kg)   LMP 09/12/2010   SpO2 99%   BMI 21.28 kg/m     General appearance: alert, cooperative and appears stated age Head: Normocephalic, without obvious abnormality, atraumatic Abdomen: soft, diffusely tender in BLQ, mildly distended, no masses,  no organomegaly No significant CVA tenderness  Pelvic: External genitalia:  no lesions              Urethra:  normal appearing urethra with no masses, tenderness or lesions              Bartholins and Skenes: normal                 Vagina: atrophic appearing vagina with normal color and discharge, no lesions              Cervix: no lesions, no CMT                Bimanual Exam:  Uterus:  anteverted, normal size, contour, position, consistency, mobility, mildly tender              Adnexa: bilaterally tender, no mass, fullness              Rectal exam: Yes.  .  Confirms.              Anus:  normal sphincter tone, no lesions  Bladder: mildly tender, more tender in the adnexal regions bilaterally  Chaperone was present for exam:  Gae Dry  1. Urinary frequency She is now on her second antibiotic for  a UTI, no culture has been done to date. I'm not convinced she has a UTI - Urinalysis,Complete w/RFL Culture: negative - phenazopyridine (PYRIDIUM) 200 MG tablet; Take 1 tablet (200 mg total) by mouth 3 (three) times daily with  meals.  Dispense: 6 tablet; Refill: 0. If this doesn't help her symptoms, doubt she has a UTI -Will send urine culture.   2. Lower abdominal pain Concern for possible GI etiology, ie diverticulitis - CBC with Differential/Platelet -Needs to get in to see a primary. She has an appointment on Friday, discussed urgent care prior to that  3. Back pain without sciatica Feels achy.

## 2021-06-22 LAB — URINE CULTURE
MICRO NUMBER:: 12481725
Result:: NO GROWTH
SPECIMEN QUALITY:: ADEQUATE

## 2021-07-12 ENCOUNTER — Other Ambulatory Visit: Payer: Self-pay

## 2021-07-12 ENCOUNTER — Ambulatory Visit
Admission: RE | Admit: 2021-07-12 | Discharge: 2021-07-12 | Disposition: A | Discharge status: Home / Self Care | Payer: No Typology Code available for payment source | Source: Ambulatory Visit | Attending: Obstetrics and Gynecology | Admitting: Obstetrics and Gynecology

## 2021-07-12 DIAGNOSIS — Z1231 Encounter for screening mammogram for malignant neoplasm of breast: Secondary | ICD-10-CM

## 2021-07-14 ENCOUNTER — Ambulatory Visit (INDEPENDENT_AMBULATORY_CARE_PROVIDER_SITE_OTHER): Payer: PRIVATE HEALTH INSURANCE | Admitting: Obstetrics and Gynecology

## 2021-07-14 ENCOUNTER — Other Ambulatory Visit: Payer: Self-pay

## 2021-07-14 ENCOUNTER — Encounter: Payer: Self-pay | Admitting: Obstetrics and Gynecology

## 2021-07-14 ENCOUNTER — Other Ambulatory Visit (HOSPITAL_COMMUNITY)
Admission: RE | Admit: 2021-07-14 | Discharge: 2021-07-14 | Disposition: A | Discharge status: Home / Self Care | Payer: PRIVATE HEALTH INSURANCE | Source: Ambulatory Visit | Attending: Obstetrics and Gynecology | Admitting: Obstetrics and Gynecology

## 2021-07-14 VITALS — BP 120/82 | HR 77 | Ht 64.0 in | Wt 131.0 lb

## 2021-07-14 DIAGNOSIS — R87610 Atypical squamous cells of undetermined significance on cytologic smear of cervix (ASC-US): Secondary | ICD-10-CM

## 2021-07-14 DIAGNOSIS — N882 Stricture and stenosis of cervix uteri: Secondary | ICD-10-CM

## 2021-07-14 DIAGNOSIS — B977 Papillomavirus as the cause of diseases classified elsewhere: Secondary | ICD-10-CM

## 2021-07-14 NOTE — Progress Notes (Signed)
GYNECOLOGY  VISIT   HPI: 60 y.o.   Divorced White or Caucasian Not Hispanic or Latino  female   [phone] with Patient's last menstrual period was 09/12/2010.   here for colposcopy.  Pap 04/27/21: ASCUS/+HPV Pap: 07/15/20 WNL Hr HPV Neg; pap 11/11/19 negative with + HPV 18/45  History of abnormal Pap:  yes history of LEEP in 5/21, negative dysplasia. 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   Her cervix is stenotic and flush with her vagina. Pretreated with vaginal estrogen for 2 weeks and cytotec early this am.   GYNECOLOGIC HISTORY: Patient's last menstrual period was 09/12/2010. 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    Past Medical History:  Diagnosis Date   Abnormal Pap smear of cervix    6/15 ASCUS HPV HR +, 11-11-2019 neg HPV HR+ 18/45+   Fibroid    Heart murmur    Mitral valve prolapse 5 YRS AGO   NOT TREATED    Past Surgical History:  Procedure Laterality Date   BREAST LIFT     CERVICAL BIOPSY  W/ LOOP ELECTRODE EXCISION     DILATION AND CURETTAGE OF UTERUS     secondary to atypical glandular cells   ENDOMETRIAL BIOPSY     atypical glandular cells   endometrial polyp     TUBAL LIGATION     UTERINE BIOPSIES  03-14-11    Current Outpatient Medications  Medication Sig Dispense Refill   ALPRAZolam (XANAX) 1 MG tablet Take one tablet one hour prior to your procedure. 1 tablet 0   estradiol (ESTRACE VAGINAL) 0.1 MG/GM vaginal cream Insert 1 gram vaginally at bedtime 2 weeks prior to procedure 42.5 g 0   misoprostol (CYTOTEC) 200 MCG tablet Insert 2 tablets vaginally 6-12 hours prior to procedure 2 tablet 0    valACYclovir (VALTREX) 500 MG tablet Take bid x 3 day with onset of  outbreak 30 tablet 12   No current facility-administered medications for this visit.     ALLERGIES: Patient has no known allergies.  Family History  Problem Relation Age of Onset   Heart murmur Mother    Atrial fibrillation Mother    Hypertension Mother    Colon cancer Mother    Prostate cancer Father     Social History   Socioeconomic History   Marital status: Divorced    Spouse name: Not on file   Number of children: Not on file   Years of education: Not on file   Highest education level: Not on file  Occupational History   Not on file  Tobacco Use   Smoking status: Former   Smokeless tobacco: Never  Substance and Sexual Activity   Alcohol use: Yes    Alcohol/week: 2.0 - 3.0 standard drinks    Types: 2 - 3 Glasses of wine per  week   Drug use: No   Sexual activity: Not Currently    Partners: Male    Birth control/protection: Surgical    Comment: BTL  Other Topics Concern   Not on file  Social History Narrative   Not on file   Social Determinants of Health   Financial Resource Strain: Not on file  Food Insecurity: Not on file  Transportation Needs: Not on file  Physical Activity: Not on file  Stress: Not on file  Social Connections: Not on file  Intimate Partner Violence: Not on file    Review of Systems  All other systems reviewed and are negative.  PHYSICAL EXAMINATION:    BP 120/82   Pulse 77   Ht 5\' 4"  (1.626 m)   Wt 131 lb (59.4 kg)   LMP 09/12/2010   SpO2 98%   BMI 22.49 kg/m     General appearance: alert, cooperative and appears stated age  Pelvic: External genitalia:  no lesions              Urethra:  normal appearing urethra with no masses, tenderness or lesions              Bartholins and Skenes: normal                 Vagina: atrophic appearing vagina with normal color and discharge, no lesions              Cervix: no lesions and flush with her vagina and extremely  stenotic (only pinpoint opening seen)               Colposcopy: cervix with only pinpoint opening, flush with the vagina. No aceto-white changes noted of the cervix/upper vagina. Lugols examination of the cervix and upper vagina, decreased lugols uptake at 4 and 10 o'clock, biopsies taken of both places.  Tenaculum placed, very difficult dilation of the cervix with the mini-dilators. Only able to dilate enough to put the cytobrush into the cervix, ECC collected with 2 cytobrushes. Tenaculum removed. No significant bleeding from the biopsy sites.   Chaperone was present for exam.  1. Atypical squamous cells of undetermined significance (ASC-US) on cervical Pap smear - Colposcopy - Surgical pathology( [place]/ [facility])  2. HPV (human papilloma virus) infection - Surgical pathology( [place]/ [facility])  3. Cervical stenosis (uterine cervix) Needed dilation, even with pretreatment with vaginal estrogen and cytotec only able to dilate to ~2 mm.

## 2021-07-14 NOTE — Patient Instructions (Signed)

## 2021-07-15 ENCOUNTER — Other Ambulatory Visit: Payer: Self-pay

## 2021-07-15 LAB — SURGICAL PATHOLOGY

## 2021-07-15 MED ORDER — ESTRADIOL 0.1 MG/GM VA CREA: TOPICAL_CREAM | VAGINAL | 2 refills | Status: DC

## 2022-03-30 DIAGNOSIS — I10 Essential (primary) hypertension: Secondary | ICD-10-CM | POA: Insufficient documentation

## 2022-04-20 NOTE — Progress Notes (Signed)
61 y.o. G33P2002 Divorced White or Caucasian Not Hispanic or Latino female here for annual exam.  No vaginal bleeding.  She states that she has been getting more uti's, getting a couple of UTI's a year. Not sexually active. She is no longer using vaginal estrogen.     history of LEEP in 5/21, negative dysplasia. 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 Pap in 6/22 was ASCUS/+HPV. Cervix was stenotic and flush with the vagina. Vaginal biopsies returned with VAIN I. ECC was benign.   H/O HSV, gets a couple of outbreaks a year (doesn't need a refill now)  Patient's last menstrual period was 09/12/2010.          Sexually active: No.  The current method of family planning is post menopausal status.    Exercising: Yes.     Walking  Smoker:  no  Health Maintenance: Pap:  02/25/21 ASCUS Hr HPV +, 07/15/20 WNL Hr HPV Neg; pap 11/11/19 negative with + HPV 18/45  History of abnormal Pap:  yes hx Colpo and LEEP MMG:  07/13/21 density B Bi-rads 1 neg  BMD:   none  Colonoscopy: 06/26/15 F/U 10 years  TDaP:  08/14/14 Gardasil: n/a   reports that she has quit smoking. She has never used smokeless tobacco. She reports current alcohol use of about 2.0 - 3.0 standard drinks of alcohol per week. She reports that she does not use drugs. 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 Two grown children and 4 grandchildren.   Past Medical History:  Diagnosis Date   Abnormal Pap smear of cervix    6/15 ASCUS HPV HR +, 11-11-2019 neg HPV HR+ 18/45+   Fibroid    Heart murmur    Mitral valve prolapse 5 YRS AGO   NOT TREATED    Past Surgical History:  Procedure Laterality Date   BREAST LIFT     CERVICAL BIOPSY  W/ LOOP ELECTRODE EXCISION     DILATION AND CURETTAGE OF UTERUS     secondary to atypical glandular cells   ENDOMETRIAL BIOPSY     atypical glandular cells   endometrial polyp     TUBAL LIGATION     UTERINE BIOPSIES  03-14-11    Current  Outpatient Medications  Medication Sig Dispense Refill   ALPRAZolam (XANAX) 1 MG tablet Take one tablet one hour prior to your procedure. 1 tablet 0   estradiol (ESTRACE VAGINAL) 0.1 MG/GM vaginal cream Insert 1 gram vaginally at bedtime twice weekly. 42.5 g 2   misoprostol (CYTOTEC) 200 MCG tablet Insert 2 tablets vaginally 6-12 hours prior to procedure 2 tablet 0   valACYclovir (VALTREX) 500 MG tablet Take bid x 3 day with onset of  outbreak 30 tablet 12   No current facility-administered medications for this visit.    Family History  Problem Relation Age of Onset   Heart murmur Mother    Atrial fibrillation Mother    Hypertension Mother    Colon cancer Mother    Prostate cancer Father     Review of Systems  All other systems reviewed and are negative.   Exam:   LMP 09/12/2010   Weight change: '@WEIGHTCHANGE'$ @ Height:      Ht Readings from Last 3 Encounters:  07/14/21 '5\' 4"'$  (1.626 m)  06/21/21 '5\' 4"'$  (1.626 m)  04/27/21 '5\' 4"'$  (1.626 m)    General appearance: alert, cooperative and appears stated age Head: Normocephalic, without obvious abnormality,  atraumatic Neck: no adenopathy, supple, symmetrical, trachea midline and thyroid normal to inspection and palpation Lungs: clear to auscultation bilaterally Cardiovascular: regular rate and rhythm Breasts: normal appearance, no masses or tenderness Abdomen: soft, non-tender; non distended,  no masses,  no organomegaly Extremities: extremities normal, atraumatic, no cyanosis or edema Skin: Skin color, texture, turgor normal. No rashes or lesions Lymph nodes: Cervical, supraclavicular, and axillary nodes normal. No abnormal inguinal nodes palpated Neurologic: Grossly normal   Pelvic: External genitalia:  no lesions              Urethra:  normal appearing urethra with no masses, tenderness or lesions              Bartholins and Skenes: normal                 Vagina: normal appearing vagina with normal color and discharge, no  lesions              Cervix:  stenotic and flush with vagina               Bimanual Exam:  Uterus:  normal size, contour, position, consistency, mobility, non-tender              Adnexa: no mass, fullness, tenderness               Rectovaginal: Confirms               Anus:  normal sphincter tone, no lesions  Gae Dry, CAM chaperoned for the exam.  1. Well woman exam Mammogram in 11/23 Colonoscopy UTD Labs with primary Discussed breast self exam Discussed calcium and vit D intake  2. Screening for cervical cancer - Cytology - PAP  3. Screening for vaginal cancer - Cytology - PAP  4. History of cervical dysplasia Cervix flush with vagina and stenotic, if she needs a colposcopy pretreat with cytotec - Cytology - PAP  5. History of vaginal dysplasia - Cytology - PAP  6. Recurrent UTI - Estradiol 10 MCG TABS vaginal tablet; Place one tablet vaginally qhs x 7 days then change to 2 x a week  Dispense: 24 tablet; Refill: 4  7. Vaginal atrophy - Estradiol 10 MCG TABS vaginal tablet; Place one tablet vaginally qhs x 7 days then change to 2 x a week  Dispense: 24 tablet; Refill: 4

## 2022-04-28 ENCOUNTER — Other Ambulatory Visit (HOSPITAL_COMMUNITY)
Admission: RE | Admit: 2022-04-28 | Discharge: 2022-04-28 | Disposition: A | Discharge status: Home / Self Care | Payer: PRIVATE HEALTH INSURANCE | Source: Ambulatory Visit | Attending: Obstetrics and Gynecology | Admitting: Obstetrics and Gynecology

## 2022-04-28 ENCOUNTER — Encounter: Payer: Self-pay | Admitting: Obstetrics and Gynecology

## 2022-04-28 ENCOUNTER — Ambulatory Visit (INDEPENDENT_AMBULATORY_CARE_PROVIDER_SITE_OTHER): Payer: PRIVATE HEALTH INSURANCE | Admitting: Obstetrics and Gynecology

## 2022-04-28 ENCOUNTER — Telehealth: Payer: Self-pay | Admitting: *Deleted

## 2022-04-28 VITALS — BP 120/82 | HR 88 | Ht 64.0 in | Wt 128.0 lb

## 2022-04-28 DIAGNOSIS — N390 Urinary tract infection, site not specified: Secondary | ICD-10-CM

## 2022-04-28 DIAGNOSIS — Z87411 Personal history of vaginal dysplasia: Secondary | ICD-10-CM

## 2022-04-28 DIAGNOSIS — Z01419 Encounter for gynecological examination (general) (routine) without abnormal findings: Secondary | ICD-10-CM | POA: Diagnosis not present

## 2022-04-28 DIAGNOSIS — Z124 Encounter for screening for malignant neoplasm of cervix: Secondary | ICD-10-CM | POA: Insufficient documentation

## 2022-04-28 DIAGNOSIS — Z1272 Encounter for screening for malignant neoplasm of vagina: Secondary | ICD-10-CM | POA: Insufficient documentation

## 2022-04-28 DIAGNOSIS — Z87410 Personal history of cervical dysplasia: Secondary | ICD-10-CM | POA: Diagnosis not present

## 2022-04-28 DIAGNOSIS — N952 Postmenopausal atrophic vaginitis: Secondary | ICD-10-CM

## 2022-04-28 DIAGNOSIS — Z9889 Other specified postprocedural states: Secondary | ICD-10-CM | POA: Insufficient documentation

## 2022-04-28 MED ORDER — ESTRADIOL 0.1 MG/GM VA CREA: TOPICAL_CREAM | VAGINAL | 1 refills | Status: AC

## 2022-04-28 MED ORDER — ESTRADIOL 10 MCG VA TABS: ORAL_TABLET | VAGINAL | 4 refills | Status: DC

## 2022-04-28 NOTE — Telephone Encounter (Signed)
Patient called staging the estradiol tablets are $500 not covered. However the pharmacist said estradiol vaginal cream will be cheaper at $35. Patient asked if you would prescribe cream. Please advise

## 2022-04-28 NOTE — Telephone Encounter (Signed)
Please let her know that I sent a script for the estrogen cream.

## 2022-04-28 NOTE — Patient Instructions (Signed)

## 2022-04-29 NOTE — Telephone Encounter (Signed)
Detailed message on patient voicemail Rx has been sent.

## 2022-05-04 LAB — CYTOLOGY - PAP
Adequacy: ABSENT
Comment: NEGATIVE
Diagnosis: NEGATIVE
High risk HPV: POSITIVE — AB

## 2022-05-05 ENCOUNTER — Telehealth: Payer: Self-pay | Admitting: *Deleted

## 2022-05-05 ENCOUNTER — Other Ambulatory Visit: Payer: Self-pay | Admitting: Obstetrics and Gynecology

## 2022-05-05 DIAGNOSIS — N882 Stricture and stenosis of cervix uteri: Secondary | ICD-10-CM

## 2022-05-05 MED ORDER — MISOPROSTOL 200 MCG PO TABS: ORAL_TABLET | ORAL | 0 refills | Status: AC

## 2022-05-05 NOTE — Telephone Encounter (Signed)
Attempted to reach patient, voicemail full. Unable to leave a message at this time.

## 2022-05-05 NOTE — Telephone Encounter (Signed)
-----   Message from Salvadore Dom, MD sent at 05/05/2022  1:25 PM EDT ----- Please inform the patient that her pap is negative, but the hpv test is +. Will her history, she needs another colposcopy. I would recommend that we schedule the colposcopy in about a month, she recently started on vaginal estrogen and I think the colposcopy will be easier after she has been on the estrogen for a while. I have also called in cytotec for her, she should place 2 tablets vaginally 6-12 hours prior to her colposcopy.

## 2022-06-05 IMAGING — MG MM DIGITAL SCREENING BILAT W/ TOMO AND CAD
8 series · 8 of 24 positions shown · non-contrast
Comparison: Previous exam(s).

CLINICAL DATA: Screening.

EXAM:
DIGITAL SCREENING BILATERAL MAMMOGRAM WITH TOMOSYNTHESIS AND CAD
TECHNIQUE: Bilateral screening digital craniocaudal and mediolateral oblique
mammograms were obtained. Bilateral screening digital breast
tomosynthesis was performed. The images were evaluated with
computer-aided detection.

[R MLO synth-2D]
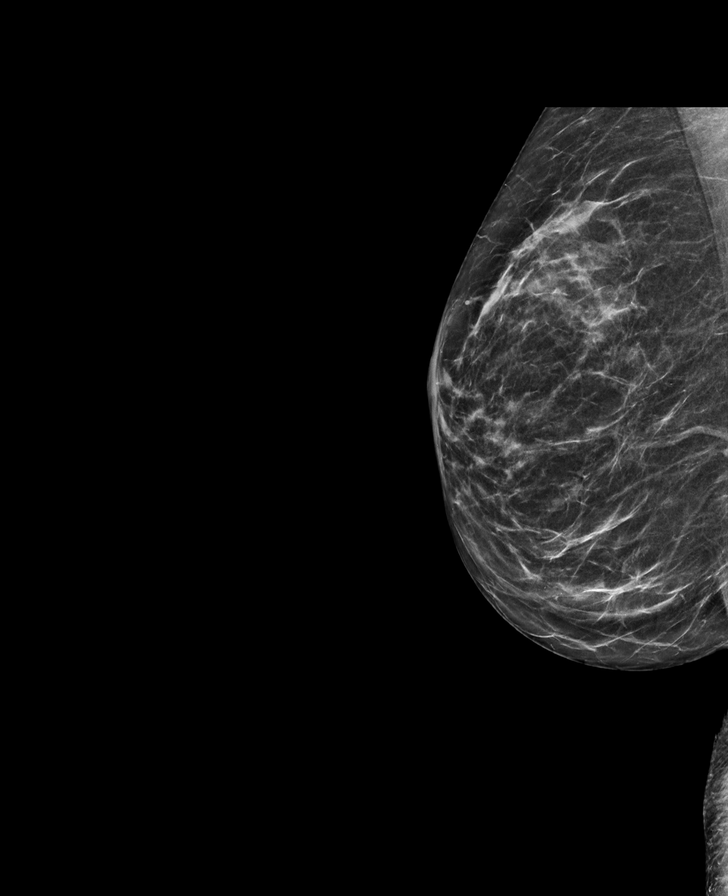

[L CC synth-2D]
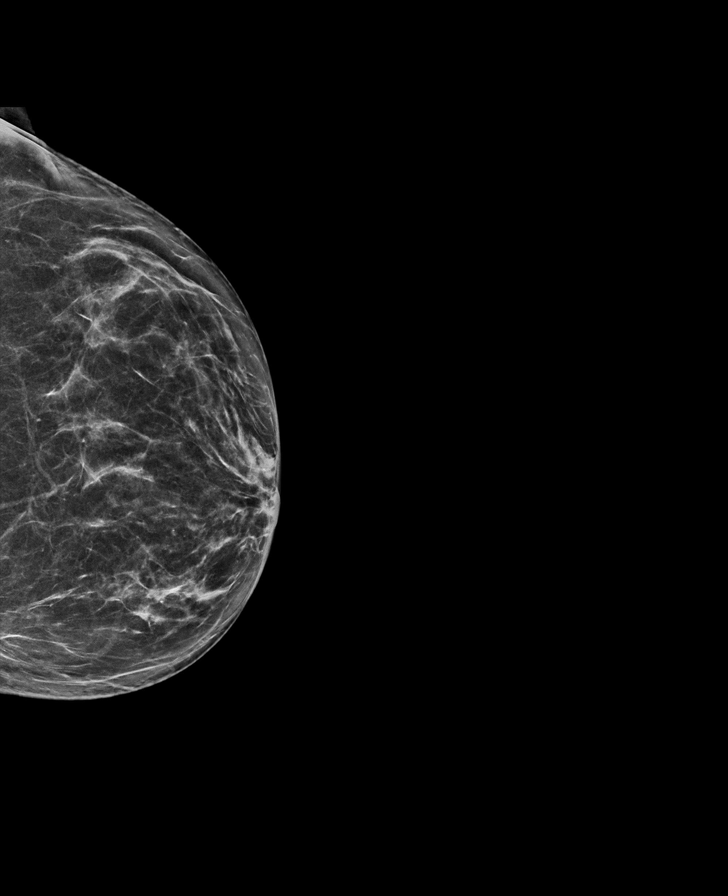

[L MLO synth-2D]
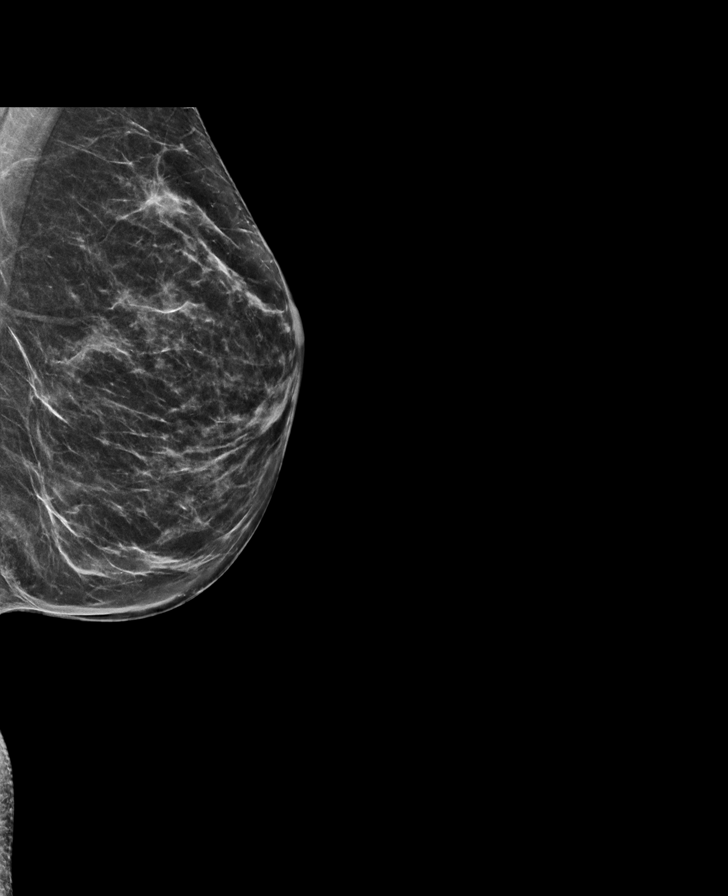

[R CC synth-2D]
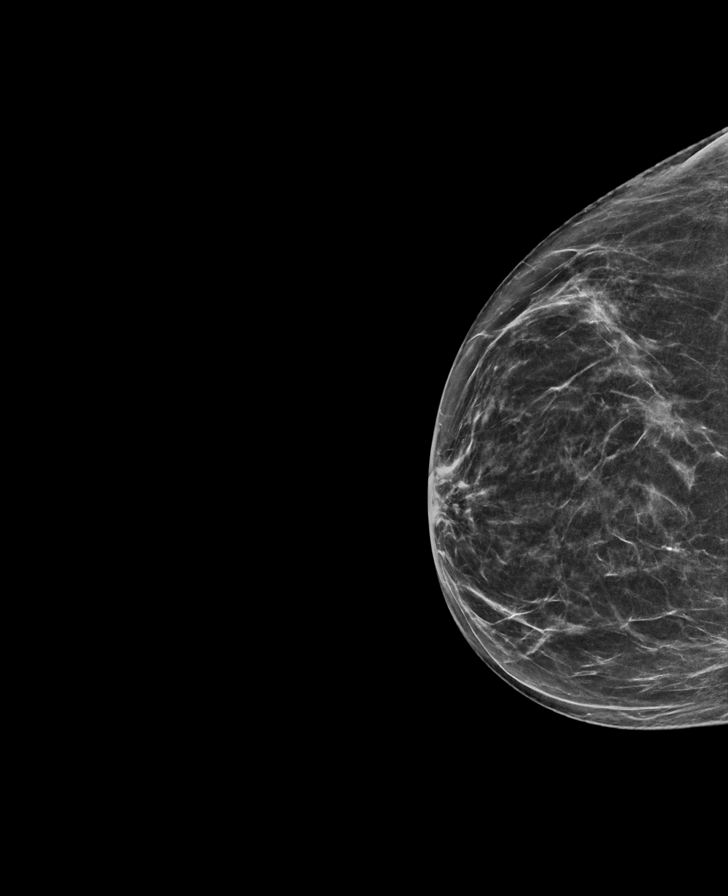

[L MLO tomo · tomo slice 33/64.0]
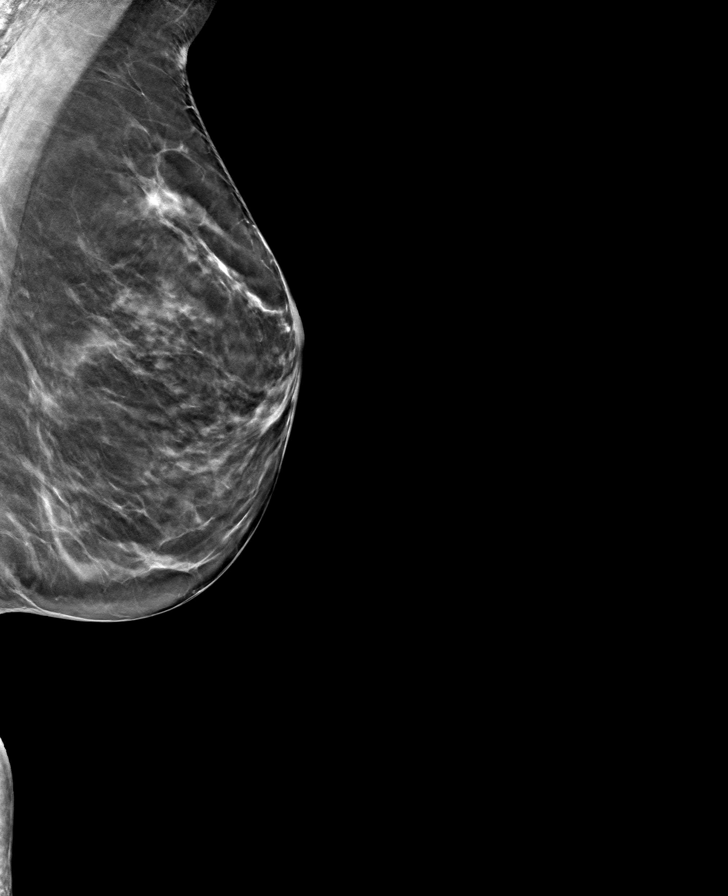

[R MLO tomo · tomo slice 31/62.0]
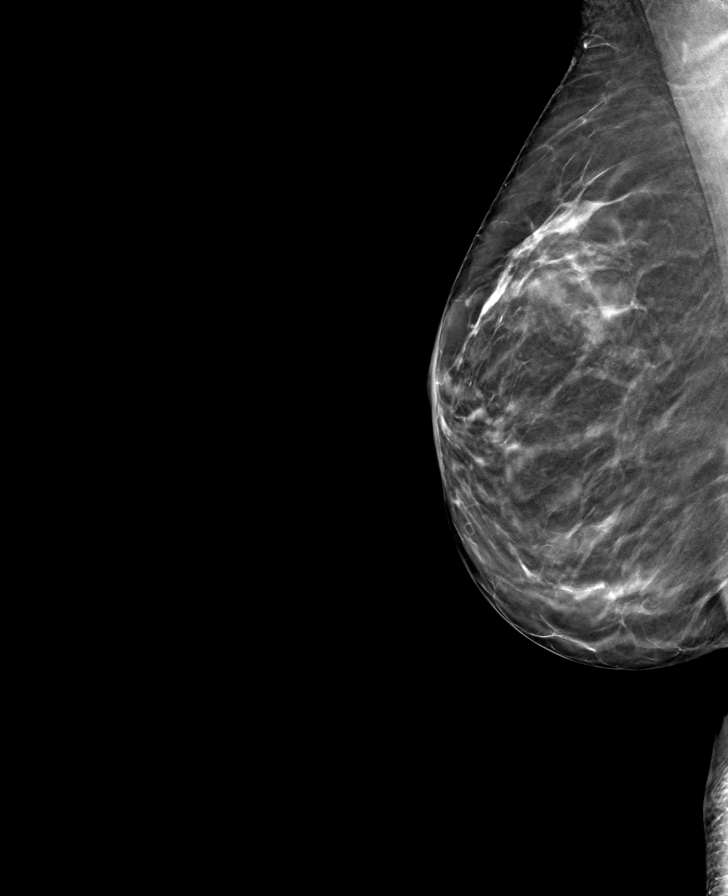

[L CC tomo · tomo slice 31/62.0]
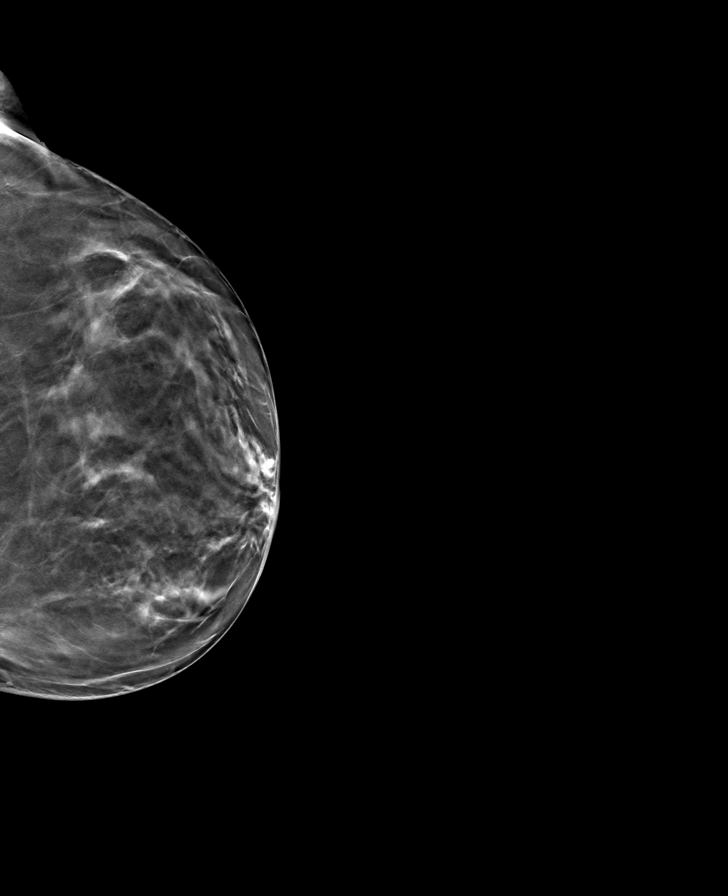

[R CC tomo · tomo slice 33/64.0]
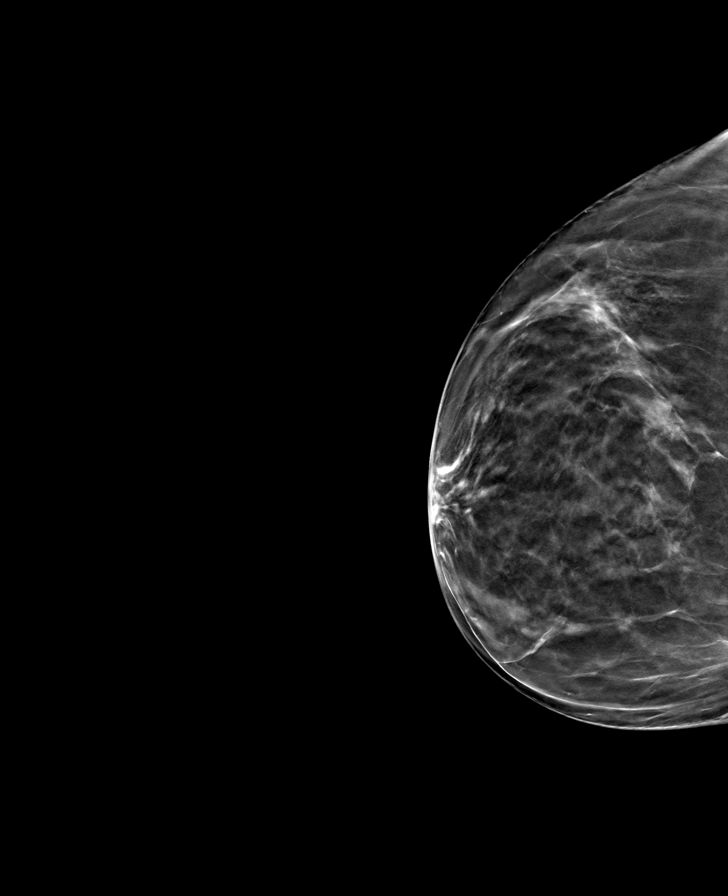

[8 of 24 positions shown; findings below may reference images not displayed]

ACR Breast Density Category b: There are scattered areas of
fibroglandular density.
FINDINGS: There are no findings suspicious for malignancy.
IMPRESSION: No mammographic evidence of malignancy. A result letter of this
screening mammogram will be mailed directly to the patient.

RECOMMENDATION:
Screening mammogram in one year. (Code:51-O-LD2)

BI-RADS CATEGORY  1: Negative.

## 2022-06-21 ENCOUNTER — Telehealth: Payer: Self-pay | Admitting: Obstetrics and Gynecology

## 2022-06-21 NOTE — Telephone Encounter (Signed)
I called patient to discuss her pap, the recommendations for colposcopy and the sequence of events. Left a message.

## 2022-06-23 NOTE — Telephone Encounter (Signed)
Call placed to patient to review concerns regarding 04/28/22 PAP results and recommendations.   Spoke with patient. Concerns discussed. 04/28/22 PAP results and recommendations reviewed per Dr. Talbert Nan. Patient continues to express concerns for need of additional testing and/or treatment. Would like to discuss alternative options, if available or appropriate. Recommended OV or MyChart to further discuss with Dr. Talbert Nan or another provider if preferred. Patient states she would like to think about recommendations a little longer and f/u next week with update on how she would like to proceed.   Advised patient Dr. Talbert Nan is out of the office next week. Advised she may contact me directly if any additional questions/concerns. Patient appreciative of f/u call and additional explanation.  Marland Kitchen

## 2022-07-28 ENCOUNTER — Other Ambulatory Visit: Payer: Self-pay | Admitting: Obstetrics and Gynecology

## 2022-07-28 DIAGNOSIS — Z1231 Encounter for screening mammogram for malignant neoplasm of breast: Secondary | ICD-10-CM

## 2022-07-29 ENCOUNTER — Ambulatory Visit
Admission: RE | Admit: 2022-07-29 | Discharge: 2022-07-29 | Disposition: A | Discharge status: Home / Self Care | Payer: PRIVATE HEALTH INSURANCE | Source: Ambulatory Visit | Attending: Obstetrics and Gynecology | Admitting: Obstetrics and Gynecology

## 2022-07-29 DIAGNOSIS — Z1231 Encounter for screening mammogram for malignant neoplasm of breast: Secondary | ICD-10-CM

## 2022-08-25 NOTE — Telephone Encounter (Signed)
See telephone encounter dated 06-21-22. Encounter closed.

## 2022-10-17 NOTE — Telephone Encounter (Signed)
Patient never returned call.  Call placed to patient. Patient is currently traveling to and from [place], will be in town on 10/19/22, request to further discuss with Dr. Talbert Nan. OV scheduled for 2/7 at 1530.   Routing to provider for final review. Patient is agreeable to disposition. Will close encounter.

## 2022-10-19 ENCOUNTER — Ambulatory Visit (INDEPENDENT_AMBULATORY_CARE_PROVIDER_SITE_OTHER): Payer: PRIVATE HEALTH INSURANCE | Admitting: Obstetrics and Gynecology

## 2022-10-19 NOTE — Progress Notes (Signed)
The patient was not seen.

## 2022-11-04 ENCOUNTER — Telehealth: Payer: Self-pay | Admitting: *Deleted

## 2022-11-04 NOTE — Telephone Encounter (Signed)
See telephone encounter dated 06/21/22.   Patient did not show for appt scheduled 10/19/22.   Letter pended, copy to Dr. Talbert Nan.

## 2022-11-10 NOTE — Telephone Encounter (Signed)
Letter reviewed and signed by Dr. Talbert Nan.  Mailed to address on file.   Routing to provider FYI.   Encounter closed.

## 2022-11-16 NOTE — Telephone Encounter (Signed)
Patient called in triage voice mail stating she was returning a call regarding an appointment with Dr. Talbert Nan for a colposcopy.

## 2022-11-18 NOTE — Telephone Encounter (Signed)
Pt notified and voiced understanding. Advised her to call around some local offices to see who is accepting new patients and who accepts her insurance and once she finds one, to call us and let us know and we can send her info.   Pt voiced understanding. States she will work on that next week.

## 2022-11-18 NOTE — Telephone Encounter (Signed)
Pt left another VM in triage line stating she wanted to see if a referral could be made to a GYN provider in [place] (for Colposcopy) since she plans to be there for the remainder of the summer. Please advise.   CC: Ashley Malone

## 2022-11-18 NOTE — Telephone Encounter (Signed)
She is welcome to schedule with a provider in [place], we can send her records. If they tell her she needs a referral she can tell us where she would like [place] to send it.

## 2022-12-14 NOTE — Telephone Encounter (Signed)
LDVM on machine per DPR to f/u and see if she has had a chance to find a GYN provider in [place]. Advised pt to leave DM in VM if no one available to answer.

## 2022-12-15 NOTE — Telephone Encounter (Signed)
FYI. Pt records were requested and sent to [facility] in [place], [place]. Will route to provider for final review and close.
